# Patient Record
Sex: Female | Born: 1985
Health system: Southern US, Community
[De-identification: ages and names within clinical notes are randomized; demographics above are authoritative.]

## PROBLEM LIST (undated history)

## (undated) DIAGNOSIS — E0590 Thyrotoxicosis, unspecified without thyrotoxic crisis or storm: Secondary | ICD-10-CM

## (undated) DIAGNOSIS — E119 Type 2 diabetes mellitus without complications: Secondary | ICD-10-CM

## (undated) HISTORY — PX: TONSILLECTOMY: SUR1361

---

## 2016-11-21 ENCOUNTER — Emergency Department (HOSPITAL_COMMUNITY)
Admission: EM | Admit: 2016-11-21 | Discharge: 2016-11-21 | Disposition: A | Discharge status: Home / Self Care | Payer: Self-pay | Attending: Emergency Medicine | Admitting: Emergency Medicine

## 2016-11-21 ENCOUNTER — Encounter (HOSPITAL_COMMUNITY): Payer: Self-pay | Admitting: Emergency Medicine

## 2016-11-21 ENCOUNTER — Emergency Department (HOSPITAL_COMMUNITY): Payer: Self-pay

## 2016-11-21 DIAGNOSIS — L03116 Cellulitis of left lower limb: Secondary | ICD-10-CM | POA: Insufficient documentation

## 2016-11-21 DIAGNOSIS — R51 Headache: Secondary | ICD-10-CM | POA: Insufficient documentation

## 2016-11-21 DIAGNOSIS — R110 Nausea: Secondary | ICD-10-CM | POA: Insufficient documentation

## 2016-11-21 DIAGNOSIS — R509 Fever, unspecified: Secondary | ICD-10-CM | POA: Insufficient documentation

## 2016-11-21 DIAGNOSIS — F17210 Nicotine dependence, cigarettes, uncomplicated: Secondary | ICD-10-CM | POA: Insufficient documentation

## 2016-11-21 DIAGNOSIS — R42 Dizziness and giddiness: Secondary | ICD-10-CM | POA: Insufficient documentation

## 2016-11-21 LAB — CBC WITH DIFFERENTIAL/PLATELET
BASOS ABS: 0 10*3/uL (ref 0.0–0.1)
BASOS PCT: 0 %
EOS ABS: 0 10*3/uL (ref 0.0–0.7)
Eosinophils Relative: 1 %
HCT: 35.7 % — ABNORMAL LOW (ref 36.0–46.0)
HEMOGLOBIN: 11.9 g/dL — AB (ref 12.0–15.0)
Lymphocytes Relative: 16 %
Lymphs Abs: 1.3 10*3/uL (ref 0.7–4.0)
MCH: 26.2 pg (ref 26.0–34.0)
MCHC: 33.3 g/dL (ref 30.0–36.0)
MCV: 78.6 fL (ref 78.0–100.0)
MONOS PCT: 4 %
Monocytes Absolute: 0.3 10*3/uL (ref 0.1–1.0)
Neutro Abs: 6.3 10*3/uL (ref 1.7–7.7)
Neutrophils Relative %: 79 %
Platelets: 237 10*3/uL (ref 150–400)
RBC: 4.54 MIL/uL (ref 3.87–5.11)
RDW: 12.1 % (ref 11.5–15.5)
WBC: 7.9 10*3/uL (ref 4.0–10.5)

## 2016-11-21 LAB — COMPREHENSIVE METABOLIC PANEL
ALK PHOS: 74 U/L (ref 38–126)
ALT: 12 U/L — AB (ref 14–54)
ANION GAP: 9 (ref 5–15)
AST: 16 U/L (ref 15–41)
Albumin: 3.3 g/dL — ABNORMAL LOW (ref 3.5–5.0)
BILIRUBIN TOTAL: 0.1 mg/dL — AB (ref 0.3–1.2)
BUN: 15 mg/dL (ref 6–20)
CALCIUM: 9 mg/dL (ref 8.9–10.3)
CO2: 23 mmol/L (ref 22–32)
CREATININE: 0.92 mg/dL (ref 0.44–1.00)
Chloride: 101 mmol/L (ref 101–111)
Glucose, Bld: 415 mg/dL — ABNORMAL HIGH (ref 65–99)
Potassium: 4.1 mmol/L (ref 3.5–5.1)
Sodium: 133 mmol/L — ABNORMAL LOW (ref 135–145)
TOTAL PROTEIN: 7.2 g/dL (ref 6.5–8.1)

## 2016-11-21 LAB — I-STAT CG4 LACTIC ACID, ED: Lactic Acid, Venous: 1.19 mmol/L (ref 0.5–1.9)

## 2016-11-21 MED ORDER — SULFAMETHOXAZOLE-TRIMETHOPRIM 800-160 MG PO TABS: 1.0000 | ORAL_TABLET | Freq: Two times a day (BID) | ORAL | 0 refills | Status: DC

## 2016-11-21 MED ORDER — OXYCODONE-ACETAMINOPHEN 5-325 MG PO TABS
1.0000 | ORAL_TABLET | Freq: Once | ORAL | Status: AC
  Administered 2016-11-21: 1 via ORAL
  Filled 2016-11-21: qty 1

## 2016-11-21 MED ORDER — SULFAMETHOXAZOLE-TRIMETHOPRIM 800-160 MG PO TABS
1.0000 | ORAL_TABLET | Freq: Once | ORAL | Status: AC
Start: 2016-11-21 — End: 2016-11-21
  Administered 2016-11-21: 1 via ORAL
  Filled 2016-11-21: qty 1

## 2016-11-21 MED ORDER — CEPHALEXIN 500 MG PO CAPS
500.0000 mg | ORAL_CAPSULE | Freq: Once | ORAL | Status: AC
  Administered 2016-11-21: 500 mg via ORAL
  Filled 2016-11-21: qty 1

## 2016-11-21 MED ORDER — METFORMIN HCL 500 MG PO TABS: 500.0000 mg | ORAL_TABLET | Freq: Two times a day (BID) | ORAL | 0 refills | Status: AC

## 2016-11-21 MED ORDER — CEPHALEXIN 500 MG PO CAPS: 500.0000 mg | ORAL_CAPSULE | Freq: Four times a day (QID) | ORAL | 0 refills | Status: DC

## 2016-11-21 MED ORDER — HYDROCODONE-ACETAMINOPHEN 5-325 MG PO TABS: 1.0000 | ORAL_TABLET | Freq: Four times a day (QID) | ORAL | 0 refills | Status: DC | PRN

## 2016-11-21 MED ORDER — METFORMIN HCL 500 MG PO TABS
500.0000 mg | ORAL_TABLET | Freq: Once | ORAL | Status: AC
  Administered 2016-11-21: 500 mg via ORAL
  Filled 2016-11-21: qty 1

## 2016-11-21 MED ORDER — IBUPROFEN 200 MG PO TABS
600.0000 mg | ORAL_TABLET | Freq: Once | ORAL | Status: AC
  Administered 2016-11-21: 600 mg via ORAL
  Filled 2016-11-21: qty 3

## 2016-11-21 NOTE — ED Triage Notes (Signed)
Patient reports stepping on a thumbtack a month ago. C/o pain to left foot with swelling and redness. Denies fevers.

## 2016-11-21 NOTE — ED Provider Notes (Signed)
[facility] DEPT Provider Note   CSN: [id number] Arrival date & time: 11/21/16  1824     History   Chief Complaint Chief Complaint  Patient presents with  . Foot Pain    HPI Victoria Everett is a 31 y.o. female who presents to the ED with left foot pain, swelling and redness that started a few days after stepping on a tack. Patient reports that she took tylenol and Aleve and put neosporin on the wound but it continues to worsen. She c/o fever and chills x 3 days.   The history is provided by the patient. No language interpreter was used.  Foot Pain  This is a new problem. The current episode started more than 2 days ago. The problem occurs constantly. The problem has been gradually worsening. Associated symptoms include headaches. Pertinent negatives include no chest pain, no abdominal pain and no shortness of breath. The symptoms are aggravated by walking and standing. Nothing relieves the symptoms. She has tried acetaminophen for the symptoms.    History reviewed. No pertinent past medical history.  There are no active problems to display for this patient.   Past Surgical History:  Procedure Laterality Date  . TONSILLECTOMY      OB History    No data available       Home Medications    Prior to Admission medications   Medication Sig Start Date End Date Taking? Authorizing Provider  cephALEXin (KEFLEX) 500 MG capsule Take 1 capsule (500 mg total) by mouth 4 (four) times daily. 11/21/16   Janne Napoleon, NP  HYDROcodone-acetaminophen (NORCO/VICODIN) 5-325 MG tablet Take 1 tablet by mouth every 6 (six) hours as needed. 11/21/16   Janne Napoleon, NP  metFORMIN (GLUCOPHAGE) 500 MG tablet Take 1 tablet (500 mg total) by mouth 2 (two) times daily with a meal. 11/21/16   Damian Leavell, [place], NP  sulfamethoxazole-trimethoprim (BACTRIM DS,SEPTRA DS) 800-160 MG tablet Take 1 tablet by mouth 2 (two) times daily. 11/21/16 11/28/16  Janne Napoleon, NP    Family History No family history on  file.  Social History Social History  Substance Use Topics  . Smoking status: Current Some Day Smoker  . Smokeless tobacco: Never Used  . Alcohol use Not on file     Allergies   Patient has no known allergies.   Review of Systems Review of Systems  Constitutional: Positive for chills and fever.  HENT: Negative.   Eyes: Negative for visual disturbance.  Respiratory: Negative for cough and shortness of breath.   Cardiovascular: Negative for chest pain.  Gastrointestinal: Positive for nausea. Negative for abdominal pain and vomiting.  Genitourinary: Negative for difficulty urinating, dysuria and frequency.  Musculoskeletal: Positive for arthralgias. Negative for neck pain.       Left foot  Skin: Positive for wound.  Neurological: Positive for light-headedness and headaches.  Psychiatric/Behavioral: Negative for confusion.     Physical Exam Updated Vital Signs BP (!) 160/100 (BP Location: Left Arm)   Pulse 92   Temp 98.9 F (37.2 C)   Resp 16   LMP 10/21/2016   SpO2 100%   Physical Exam  Constitutional: She appears well-developed and well-nourished. No distress.  Eyes: EOM are normal.  Neck: Neck supple.  Cardiovascular: Normal rate.   Pulmonary/Chest: Effort normal.  Abdominal: Soft. There is no tenderness.  Musculoskeletal:       Left foot: There is decreased range of motion (due to swelling and pain), tenderness and swelling. There is normal capillary refill  and no deformity. Lacerations: puncture wound.       Feet:  There is a puncture wound to the dorsum of the left foot. There is tenderness, erythema and swelling of the foot and ankle.   Neurological: She is alert.  Skin: Skin is warm and dry.  Psychiatric: She has a normal mood and affect.  Nursing note and vitals reviewed.    ED Treatments / Results  Labs (all labs ordered are listed, but only abnormal results are displayed) Labs Reviewed  COMPREHENSIVE METABOLIC PANEL - Abnormal; Notable for the  following:       Result Value   Sodium 133 (*)    Glucose, Bld 415 (*)    Albumin 3.3 (*)    ALT 12 (*)    Total Bilirubin 0.1 (*)    All other components within normal limits  CBC WITH DIFFERENTIAL/PLATELET - Abnormal; Notable for the following:    Hemoglobin 11.9 (*)    HCT 35.7 (*)    All other components within normal limits  URINALYSIS, ROUTINE W REFLEX MICROSCOPIC  I-STAT CG4 LACTIC ACID, ED   Radiology Dg Foot 2 Views Left  Result Date: 11/21/2016 CLINICAL DATA:  Stepped on attack 2 weeks ago. Now with recent fever and swelling EXAM: LEFT FOOT - 2 VIEW COMPARISON:  None. FINDINGS: There is no evidence of fracture or dislocation. There is no evidence of arthropathy or other focal bone abnormality. Mild diffuse soft tissue swelling. IMPRESSION: 1. No acute bone abnormality. 2. Diffuse soft tissue swelling. Electronically Signed   By: Signa Kell M.D.   On: 11/21/2016 21:49    Procedures Procedures (including critical care time)  Medications Ordered in ED Medications  oxyCODONE-acetaminophen (PERCOCET/ROXICET) 5-325 MG per tablet 1 tablet (1 tablet Oral Given 11/21/16 2201)  cephALEXin (KEFLEX) capsule 500 mg (500 mg Oral Given 11/21/16 2250)  sulfamethoxazole-trimethoprim (BACTRIM DS,SEPTRA DS) 800-160 MG per tablet 1 tablet (1 tablet Oral Given 11/21/16 2250)  metFORMIN (GLUCOPHAGE) tablet 500 mg (500 mg Oral Given 11/21/16 2250)  ibuprofen (ADVIL,MOTRIN) tablet 600 mg (600 mg Oral Given 11/21/16 2302)  oxyCODONE-acetaminophen (PERCOCET/ROXICET) 5-325 MG per tablet 1 tablet (1 tablet Oral Given 11/21/16 2302)     Initial Impression / Assessment and Plan / ED Course  I have reviewed the triage vital signs and the nursing notes. Dr. Rhunette Croft in to examine the patient and discuss plan of care and need for f/u.   31 y.o. female with new onset diabetes and left foot cellulitis stable for d/c to f/u with the wound clinic and a PCP to manage diabetes. Will start Bactrim, Keflex and  Metformin. Order for Care Management consult and referral to wound clinic. Return precautions.   Final Clinical Impressions(s) / ED Diagnoses   Final diagnoses:  Cellulitis of foot, left    New Prescriptions New Prescriptions   CEPHALEXIN (KEFLEX) 500 MG CAPSULE    Take 1 capsule (500 mg total) by mouth 4 (four) times daily.   HYDROCODONE-ACETAMINOPHEN (NORCO/VICODIN) 5-325 MG TABLET    Take 1 tablet by mouth every 6 (six) hours as needed.   METFORMIN (GLUCOPHAGE) 500 MG TABLET    Take 1 tablet (500 mg total) by mouth 2 (two) times daily with a meal.   SULFAMETHOXAZOLE-TRIMETHOPRIM (BACTRIM DS,SEPTRA DS) 800-160 MG TABLET    Take 1 tablet by mouth 2 (two) times daily.     Kerrie Buffalo [place], [place] 11/21/16 1610    Derwood Kaplan, MD 11/22/16 [phone]

## 2016-11-24 ENCOUNTER — Inpatient Hospital Stay (HOSPITAL_COMMUNITY)
Admission: EM | Admit: 2016-11-24 | Discharge: 2016-11-28 | DRG: 638 | Disposition: A | Discharge status: Home / Self Care | Payer: Self-pay | Attending: Internal Medicine | Admitting: Internal Medicine

## 2016-11-24 ENCOUNTER — Encounter (HOSPITAL_COMMUNITY): Payer: Self-pay | Admitting: Emergency Medicine

## 2016-11-24 DIAGNOSIS — L0390 Cellulitis, unspecified: Secondary | ICD-10-CM | POA: Diagnosis present

## 2016-11-24 DIAGNOSIS — Z87891 Personal history of nicotine dependence: Secondary | ICD-10-CM

## 2016-11-24 DIAGNOSIS — E1165 Type 2 diabetes mellitus with hyperglycemia: Secondary | ICD-10-CM | POA: Diagnosis present

## 2016-11-24 DIAGNOSIS — Z791 Long term (current) use of non-steroidal anti-inflammatories (NSAID): Secondary | ICD-10-CM

## 2016-11-24 DIAGNOSIS — R030 Elevated blood-pressure reading, without diagnosis of hypertension: Secondary | ICD-10-CM | POA: Diagnosis present

## 2016-11-24 DIAGNOSIS — W228XXA Striking against or struck by other objects, initial encounter: Secondary | ICD-10-CM | POA: Diagnosis present

## 2016-11-24 DIAGNOSIS — E119 Type 2 diabetes mellitus without complications: Secondary | ICD-10-CM

## 2016-11-24 DIAGNOSIS — L97429 Non-pressure chronic ulcer of left heel and midfoot with unspecified severity: Secondary | ICD-10-CM | POA: Diagnosis present

## 2016-11-24 DIAGNOSIS — L03116 Cellulitis of left lower limb: Secondary | ICD-10-CM | POA: Diagnosis present

## 2016-11-24 DIAGNOSIS — Z833 Family history of diabetes mellitus: Secondary | ICD-10-CM

## 2016-11-24 DIAGNOSIS — L97421 Non-pressure chronic ulcer of left heel and midfoot limited to breakdown of skin: Secondary | ICD-10-CM

## 2016-11-24 DIAGNOSIS — D649 Anemia, unspecified: Secondary | ICD-10-CM | POA: Diagnosis present

## 2016-11-24 DIAGNOSIS — Z7984 Long term (current) use of oral hypoglycemic drugs: Secondary | ICD-10-CM

## 2016-11-24 DIAGNOSIS — E11621 Type 2 diabetes mellitus with foot ulcer: Principal | ICD-10-CM | POA: Diagnosis present

## 2016-11-24 DIAGNOSIS — E1142 Type 2 diabetes mellitus with diabetic polyneuropathy: Secondary | ICD-10-CM | POA: Diagnosis present

## 2016-11-24 DIAGNOSIS — I10 Essential (primary) hypertension: Secondary | ICD-10-CM | POA: Diagnosis present

## 2016-11-24 DIAGNOSIS — E08621 Diabetes mellitus due to underlying condition with foot ulcer: Secondary | ICD-10-CM

## 2016-11-24 LAB — BASIC METABOLIC PANEL
Anion gap: 10 (ref 5–15)
BUN: 16 mg/dL (ref 6–20)
CALCIUM: 9.4 mg/dL (ref 8.9–10.3)
CO2: 25 mmol/L (ref 22–32)
CREATININE: 0.88 mg/dL (ref 0.44–1.00)
Chloride: 97 mmol/L — ABNORMAL LOW (ref 101–111)
GFR calc Af Amer: >60 mL/min (ref >60)
GLUCOSE: 381 mg/dL — AB (ref 65–99)
Potassium: 4.5 mmol/L (ref 3.5–5.1)
Sodium: 132 mmol/L — ABNORMAL LOW (ref 135–145)

## 2016-11-24 LAB — CBC WITH DIFFERENTIAL/PLATELET
Basophils Absolute: 0 10*3/uL (ref 0.0–0.1)
Basophils Relative: 0 %
EOS PCT: 1 %
Eosinophils Absolute: 0.1 10*3/uL (ref 0.0–0.7)
HCT: 35.1 % — ABNORMAL LOW (ref 36.0–46.0)
Hemoglobin: 11.8 g/dL — ABNORMAL LOW (ref 12.0–15.0)
LYMPHS ABS: 2.4 10*3/uL (ref 0.7–4.0)
LYMPHS PCT: 30 %
MCH: 26.4 pg (ref 26.0–34.0)
MCHC: 33.6 g/dL (ref 30.0–36.0)
MCV: 78.5 fL (ref 78.0–100.0)
MONO ABS: 0.4 10*3/uL (ref 0.1–1.0)
Monocytes Relative: 5 %
Neutro Abs: 5.2 10*3/uL (ref 1.7–7.7)
Neutrophils Relative %: 64 %
PLATELETS: 313 10*3/uL (ref 150–400)
RBC: 4.47 MIL/uL (ref 3.87–5.11)
RDW: 12.1 % (ref 11.5–15.5)
WBC: 8.1 10*3/uL (ref 4.0–10.5)

## 2016-11-24 LAB — URINALYSIS, ROUTINE W REFLEX MICROSCOPIC
BILIRUBIN URINE: NEGATIVE
Glucose, UA: >=500 mg/dL — AB
Ketones, ur: NEGATIVE mg/dL
Leukocytes, UA: NEGATIVE
Nitrite: NEGATIVE
Protein, ur: >=300 mg/dL — AB
SPECIFIC GRAVITY, URINE: 1.024 (ref 1.005–1.030)
pH: 5 (ref 5.0–8.0)

## 2016-11-24 LAB — CBG MONITORING, ED: Glucose-Capillary: 386 mg/dL — ABNORMAL HIGH (ref 65–99)

## 2016-11-24 MED ORDER — SODIUM CHLORIDE 0.9 % IV SOLN
INTRAVENOUS | Status: DC
Start: 2016-11-24 — End: 2016-11-24
  Administered 2016-11-24: 22:00:00 via INTRAVENOUS

## 2016-11-24 MED ORDER — ONDANSETRON 4 MG PO TBDP
4.0000 mg | ORAL_TABLET | Freq: Once | ORAL | Status: AC
  Administered 2016-11-24: 4 mg via ORAL
  Filled 2016-11-24: qty 1

## 2016-11-24 MED ORDER — VANCOMYCIN HCL IN DEXTROSE 1-5 GM/200ML-% IV SOLN
1000.0000 mg | Freq: Once | INTRAVENOUS | Status: AC
  Administered 2016-11-24: 1000 mg via INTRAVENOUS
  Filled 2016-11-24: qty 200

## 2016-11-24 MED ORDER — HYDROMORPHONE HCL 1 MG/ML IJ SOLN
1.0000 mg | Freq: Once | INTRAMUSCULAR | Status: AC
  Administered 2016-11-24: 1 mg via INTRAVENOUS
  Filled 2016-11-24: qty 1

## 2016-11-24 NOTE — ED Notes (Signed)
Bed: WA05 Expected date:  Expected time:  Means of arrival:  Comments: 

## 2016-11-24 NOTE — ED Provider Notes (Signed)
[facility] Provider Note   CSN: [id number] Arrival date & time: 11/24/16  1750     History   Chief Complaint Chief Complaint  Patient presents with  . Foot Swelling  . Foot Pain    HPI Victoria Everett is a 31 y.o. female.  HPI  Patient with history of non-insulin-dependent diabetes comes in with chief complaint of foot pain. Patient has had an ulcer to her left foot for the past few days. Patient stepped on a sharp object (not a nail) which has worsened over the past few days. Patient noticing swelling over the left foot. Patient was seen in the ER on 9:30, and discharged with Bactrim and Keflex along with wound care follow-up. Patient called her clinic however she will not be afforded an appointment until the middle of the month. Patient reports that since her visit to the emergency room the swelling has gotten worse and she has clear discharge from her lesion. Patient denies any nausea or vomiting or fevers, she is having some subjective chills. Patient also doesn't have a primary care doctor in [place] she is new to state.  History reviewed. No pertinent past medical history.  Patient Active Problem List   Diagnosis Date Noted  . Cellulitis of foot, left 11/25/2016    Past Surgical History:  Procedure Laterality Date  . TONSILLECTOMY      OB History    No data available       Home Medications    Prior to Admission medications   Medication Sig Start Date End Date Taking? Authorizing Provider  cephALEXin (KEFLEX) 500 MG capsule Take 1 capsule (500 mg total) by mouth 4 (four) times daily. 11/21/16  Yes Neese, Bridgette Habermann M, NP  metFORMIN (GLUCOPHAGE) 500 MG tablet Take 1 tablet (500 mg total) by mouth 2 (two) times daily with a meal. 11/21/16  Yes Neese, Cando, NP  naproxen sodium (ANAPROX) 220 MG tablet Take 440 mg by mouth 2 (two) times daily with a meal.   Yes [provider]  sulfamethoxazole-trimethoprim (BACTRIM DS,SEPTRA DS) 800-160 MG tablet  Take 1 tablet by mouth 2 (two) times daily. 11/21/16 11/28/16 Yes Neese, Hope M, NP  HYDROcodone-acetaminophen (NORCO/VICODIN) 5-325 MG tablet Take 1 tablet by mouth every 6 (six) hours as needed. 11/21/16   Janne Napoleon, NP    Family History History reviewed. No pertinent family history.  Social History Social History  Substance Use Topics  . Smoking status: Current Some Day Smoker  . Smokeless tobacco: Never Used  . Alcohol use Not on file     Allergies   Patient has no known allergies.   Review of Systems Review of Systems  Constitutional: Positive for chills. Negative for fever.  Musculoskeletal: Positive for myalgias.  Skin: Positive for wound.  Allergic/Immunologic: Negative for immunocompromised state.     Physical Exam Updated Vital Signs BP (!) 161/102 (BP Location: Right Arm)   Pulse 88   Temp 98.3 F (36.8 C) (Oral)   Resp 16   Ht  (1.803 m)   Wt 95.6 kg (210 lb 11.2 oz)   LMP 11/21/2016   SpO2 99%   BMI 29.39 kg/m   Physical Exam  Constitutional: She is oriented to person, place, and time. She appears well-developed.  HENT:  Head: Normocephalic and atraumatic.  Eyes: EOM are normal.  Neck: Normal range of motion. Neck supple.  Cardiovascular: Normal rate.   Pulmonary/Chest: Effort normal.  Abdominal: Bowel sounds are normal.  Musculoskeletal: She exhibits  edema.  Pt has an ulcer on the plantar side of the L foot. There is no surrounding erythema. There is no active purulent drainage.  + edema of the leg extending to the mid-tibia level.  Neurological: She is alert and oriented to person, place, and time.  Skin: Skin is warm and dry.  Nursing note and vitals reviewed.    ED Treatments / Results  Labs (all labs ordered are listed, but only abnormal results are displayed) Labs Reviewed  CBC WITH DIFFERENTIAL/PLATELET - Abnormal; Notable for the following:       Result Value   Hemoglobin 11.8 (*)    HCT 35.1 (*)    All other components  within normal limits  BASIC METABOLIC PANEL - Abnormal; Notable for the following:    Sodium 132 (*)    Chloride 97 (*)    Glucose, Bld 381 (*)    All other components within normal limits  URINALYSIS, ROUTINE W REFLEX MICROSCOPIC - Abnormal; Notable for the following:    APPearance HAZY (*)    Glucose, UA >=500 (*)    Hgb urine dipstick MODERATE (*)    Protein, ur >=300 (*)    Bacteria, UA RARE (*)    Squamous Epithelial / LPF 0-5 (*)    All other components within normal limits  SEDIMENTATION RATE - Abnormal; Notable for the following:    Sed Rate 73 (*)    All other components within normal limits  CBG MONITORING, ED - Abnormal; Notable for the following:    Glucose-Capillary 386 (*)    All other components within normal limits  C-REACTIVE PROTEIN  PREGNANCY, URINE    EKG  EKG Interpretation None       Radiology No results found.  Procedures Procedures (including critical care time)  Medications Ordered in ED Medications  0.9 %  sodium chloride infusion ( Intravenous New Bag/Given 11/24/16 2156)  ondansetron (ZOFRAN-ODT) disintegrating tablet 4 mg (4 mg Oral Given 11/24/16 2157)  HYDROmorphone (DILAUDID) injection 1 mg (1 mg Intravenous Given 11/24/16 2156)  vancomycin (VANCOCIN) IVPB 1000 mg/200 mL premix (0 mg Intravenous Stopped 11/24/16 2256)  HYDROcodone-acetaminophen (NORCO/VICODIN) 5-325 MG per tablet 1 tablet (1 tablet Oral Given 11/25/16 0138)     Initial Impression / Assessment and Plan / ED Course  I have reviewed the triage vital signs and the nursing notes.  Pertinent labs & imaging results that were available during my care of the patient were reviewed by me and considered in my medical decision making (see chart for details).  Clinical Course as of Nov 26 143  Thu Nov 25, 2016  0144 With elevated sed rate, worsening leg swelling, new onset diabetes that is not controlled well, lack of proper f/u  - we will admit to the hospital. Sed Rate: (!) 73  [AN]    Clinical Course User Index [AN] Derwood Kaplan, MD   Patient comes in with chief complaint of leg pain. Patient is diabetic and has a foot ulcer. The ulcer has been present now close to 2 weeks. Patient has been started on antibiotics, however she reports that the edema in her leg has gotten worse. Patient does not have any systemic constitutional review of system besides subjective chills. Patient's foot exam does not reveal any evidence of active abscess or gas. X-ray from last visit reviewed.   nfortunately patient was unable to get a wound care follow-up appointment until middle of this month, and she doesn't have a primary care doctor. Therefore with her  stating that her swelling has gotten worse over the past with today's is a little bit concerning to me. Effectively elect to make sure the patient is not having any osteomyelitis. Sedimentation rate and CRP have been added to the lab workup. f the labs are elevated we will discharge patient and asked her to return for MRI.  Patient is already received a dose of vancomycin while waiting for my care.  Final Clinical Impressions(s) / ED Diagnoses   Final diagnoses:  New onset type 2 diabetes mellitus (HCC)  Diabetic ulcer of left midfoot associated with diabetes mellitus due to underlying condition, limited to breakdown of skin [facility])    New Prescriptions New Prescriptions   No medications on file     Derwood Kaplan, MD 11/25/16 0145

## 2016-11-24 NOTE — ED Triage Notes (Signed)
Patient has left foot swelling and pain. Patient was seen in the ED on Sunday. Patient states she tried to go to wound care were she was referred and says they have no appointments available.

## 2016-11-24 NOTE — ED Provider Notes (Signed)
MSE was initiated and I personally evaluated the patient and placed orders (if any) at  7:40 PM on November 24, 2016.  BP (!) 145/94 (BP Location: Left Arm)   Pulse 96   Temp 98.9 F (37.2 C) (Oral)   Resp 18   Ht  (1.803 m)   Wt 95.6 kg (210 lb 11.2 oz)   LMP 11/21/2016   SpO2 97%   BMI 29.39 kg/m   HPI Victoria Everett is a 30 y.o. female who presents to the ED for recheck of her wound. Patient was evaluated here 11/21/16 by Dr. Rhunette Croft and me for small ulcer/callous like lesion to the plantar aspect of the left foot after stepping on a nail. She was treated at the first visit for cellulitis and found to be a diabetic. She was referred to [facility] and to the Wound Clinic for follow up. A future order for Case Manager Consult was placed. Patient d/c home with Keflex, Bactrim, hydrocodone and Metformin.  Patient reports that she can no get an appointment with the Wound clinic. The foot pain and swelling has gotten worse.    Results for orders placed or performed during the hospital encounter of 11/24/16 (from the past 24 hour(s))  POC CBG, ED     Status: Abnormal   Collection Time: 11/24/16  7:22 PM  Result Value Ref Range   Glucose-Capillary 386 (H) 65 - 99 mg/dL    The patient appears stable so that the remainder of the MSE may be completed by another provider.   Kerrie Buffalo [place], [place] 11/24/16 1950    Arby Barrette, MD 11/25/16 2342

## 2016-11-25 ENCOUNTER — Encounter (HOSPITAL_COMMUNITY): Payer: Self-pay | Admitting: Internal Medicine

## 2016-11-25 ENCOUNTER — Inpatient Hospital Stay (HOSPITAL_COMMUNITY): Payer: Self-pay

## 2016-11-25 DIAGNOSIS — R030 Elevated blood-pressure reading, without diagnosis of hypertension: Secondary | ICD-10-CM

## 2016-11-25 DIAGNOSIS — L0390 Cellulitis, unspecified: Secondary | ICD-10-CM | POA: Diagnosis present

## 2016-11-25 DIAGNOSIS — D649 Anemia, unspecified: Secondary | ICD-10-CM | POA: Diagnosis present

## 2016-11-25 DIAGNOSIS — E119 Type 2 diabetes mellitus without complications: Secondary | ICD-10-CM

## 2016-11-25 DIAGNOSIS — L03116 Cellulitis of left lower limb: Secondary | ICD-10-CM | POA: Diagnosis present

## 2016-11-25 LAB — GLUCOSE, CAPILLARY
GLUCOSE-CAPILLARY: 196 mg/dL — AB (ref 65–99)
GLUCOSE-CAPILLARY: 318 mg/dL — AB (ref 65–99)
Glucose-Capillary: 326 mg/dL — ABNORMAL HIGH (ref 65–99)
Glucose-Capillary: 349 mg/dL — ABNORMAL HIGH (ref 65–99)
Glucose-Capillary: 303 mg/dL — ABNORMAL HIGH (ref 65–99)

## 2016-11-25 LAB — BASIC METABOLIC PANEL
Anion gap: 8 (ref 5–15)
BUN: 13 mg/dL (ref 6–20)
CHLORIDE: 99 mmol/L — AB (ref 101–111)
CO2: 26 mmol/L (ref 22–32)
Calcium: 8.7 mg/dL — ABNORMAL LOW (ref 8.9–10.3)
Creatinine, Ser: 0.69 mg/dL (ref 0.44–1.00)
GFR calc Af Amer: >60 mL/min (ref >60)
GFR calc non Af Amer: >60 mL/min (ref >60)
GLUCOSE: 343 mg/dL — AB (ref 65–99)
POTASSIUM: 3.8 mmol/L (ref 3.5–5.1)
Sodium: 133 mmol/L — ABNORMAL LOW (ref 135–145)

## 2016-11-25 LAB — PREGNANCY, URINE: Preg Test, Ur: NEGATIVE

## 2016-11-25 LAB — HIV ANTIBODY (ROUTINE TESTING W REFLEX): HIV Screen 4th Generation wRfx: NONREACTIVE

## 2016-11-25 LAB — VITAMIN B12: Vitamin B-12: 449 pg/mL (ref 180–914)

## 2016-11-25 LAB — CBC
HEMATOCRIT: 31.4 % — AB (ref 36.0–46.0)
Hemoglobin: 10.3 g/dL — ABNORMAL LOW (ref 12.0–15.0)
MCH: 25.4 pg — ABNORMAL LOW (ref 26.0–34.0)
MCHC: 32.8 g/dL (ref 30.0–36.0)
MCV: 77.5 fL — AB (ref 78.0–100.0)
Platelets: 252 10*3/uL (ref 150–400)
RBC: 4.05 MIL/uL (ref 3.87–5.11)
RDW: 11.9 % (ref 11.5–15.5)
WBC: 5.7 10*3/uL (ref 4.0–10.5)

## 2016-11-25 LAB — RETICULOCYTES
RBC.: 4.05 MIL/uL (ref 3.87–5.11)
Retic Count, Absolute: 64.8 10*3/uL (ref 19.0–186.0)
Retic Ct Pct: 1.6 % (ref 0.4–3.1)

## 2016-11-25 LAB — FERRITIN: Ferritin: 123 ng/mL (ref 11–307)

## 2016-11-25 LAB — FOLATE: FOLATE: 20.5 ng/mL (ref >5.9)

## 2016-11-25 LAB — C-REACTIVE PROTEIN: CRP: 4 mg/dL — ABNORMAL HIGH (ref <1.0)

## 2016-11-25 LAB — IRON AND TIBC
Iron: 28 ug/dL (ref 28–170)
SATURATION RATIOS: 11 % (ref 10.4–31.8)
TIBC: 259 ug/dL (ref 250–450)
UIBC: 231 ug/dL

## 2016-11-25 LAB — HEMOGLOBIN A1C
HEMOGLOBIN A1C: 13.3 % — AB (ref 4.8–5.6)
MEAN PLASMA GLUCOSE: 335.01 mg/dL

## 2016-11-25 LAB — SEDIMENTATION RATE: Sed Rate: 73 mm/hr — ABNORMAL HIGH (ref 0–22)

## 2016-11-25 MED ORDER — INSULIN ASPART 100 UNIT/ML ~~LOC~~ SOLN
0.0000 [IU] | Freq: Three times a day (TID) | SUBCUTANEOUS | Status: DC
  Administered 2016-11-25: 7 [IU] via SUBCUTANEOUS
  Administered 2016-11-25: 2 [IU] via SUBCUTANEOUS
  Administered 2016-11-25 – 2016-11-26 (×2): 7 [IU] via SUBCUTANEOUS
  Administered 2016-11-26: 5 [IU] via SUBCUTANEOUS
  Administered 2016-11-26: 3 [IU] via SUBCUTANEOUS
  Administered 2016-11-27: 5 [IU] via SUBCUTANEOUS
  Administered 2016-11-27: 3 [IU] via SUBCUTANEOUS
  Administered 2016-11-27: 7 [IU] via SUBCUTANEOUS
  Administered 2016-11-28: 5 [IU] via SUBCUTANEOUS

## 2016-11-25 MED ORDER — ENOXAPARIN SODIUM 40 MG/0.4ML ~~LOC~~ SOLN
40.0000 mg | SUBCUTANEOUS | Status: DC
  Administered 2016-11-25 – 2016-11-28 (×4): 40 mg via SUBCUTANEOUS
  Filled 2016-11-25 (×4): qty 0.4

## 2016-11-25 MED ORDER — PIPERACILLIN-TAZOBACTAM 3.375 G IVPB
3.3750 g | Freq: Three times a day (TID) | INTRAVENOUS | Status: DC
  Administered 2016-11-25 – 2016-11-26 (×3): 3.375 g via INTRAVENOUS
  Filled 2016-11-25 (×5): qty 50

## 2016-11-25 MED ORDER — PIPERACILLIN-TAZOBACTAM 3.375 G IVPB 30 MIN
3.3750 g | Freq: Once | INTRAVENOUS | Status: AC
  Administered 2016-11-25: 3.375 g via INTRAVENOUS
  Filled 2016-11-25: qty 50

## 2016-11-25 MED ORDER — ONDANSETRON HCL 4 MG PO TABS: 4.0000 mg | ORAL_TABLET | Freq: Four times a day (QID) | ORAL | Status: DC | PRN

## 2016-11-25 MED ORDER — HYDROCODONE-ACETAMINOPHEN 5-325 MG PO TABS
1.0000 | ORAL_TABLET | ORAL | Status: DC | PRN
  Administered 2016-11-25 – 2016-11-28 (×3): 1 via ORAL
  Filled 2016-11-25 (×4): qty 1

## 2016-11-25 MED ORDER — LISINOPRIL 20 MG PO TABS
20.0000 mg | ORAL_TABLET | Freq: Every day | ORAL | Status: DC
  Administered 2016-11-25 – 2016-11-26 (×2): 20 mg via ORAL
  Filled 2016-11-25 (×2): qty 1

## 2016-11-25 MED ORDER — INSULIN GLARGINE 100 UNIT/ML ~~LOC~~ SOLN
5.0000 [IU] | Freq: Every day | SUBCUTANEOUS | Status: DC
  Administered 2016-11-25: 5 [IU] via SUBCUTANEOUS
  Filled 2016-11-25: qty 0.05

## 2016-11-25 MED ORDER — ACETAMINOPHEN 325 MG PO TABS
650.0000 mg | ORAL_TABLET | Freq: Four times a day (QID) | ORAL | Status: DC | PRN
  Administered 2016-11-25 – 2016-11-26 (×2): 650 mg via ORAL
  Filled 2016-11-25 (×2): qty 2

## 2016-11-25 MED ORDER — TRAMADOL HCL 50 MG PO TABS
50.0000 mg | ORAL_TABLET | Freq: Four times a day (QID) | ORAL | Status: DC | PRN
  Administered 2016-11-25: 50 mg via ORAL
  Filled 2016-11-25: qty 1

## 2016-11-25 MED ORDER — LIVING WELL WITH DIABETES BOOK
Freq: Once | Status: AC
  Administered 2016-11-25: 12:00:00
  Filled 2016-11-25: qty 1

## 2016-11-25 MED ORDER — HYDROCODONE-ACETAMINOPHEN 5-325 MG PO TABS
1.0000 | ORAL_TABLET | Freq: Once | ORAL | Status: AC
  Administered 2016-11-25: 1 via ORAL
  Filled 2016-11-25: qty 1

## 2016-11-25 MED ORDER — SODIUM CHLORIDE 0.9 % IV SOLN
1250.0000 mg | Freq: Two times a day (BID) | INTRAVENOUS | Status: DC
  Administered 2016-11-25 – 2016-11-26 (×3): 1250 mg via INTRAVENOUS
  Filled 2016-11-25 (×3): qty 1250

## 2016-11-25 MED ORDER — ONDANSETRON HCL 4 MG/2ML IJ SOLN
4.0000 mg | Freq: Four times a day (QID) | INTRAMUSCULAR | Status: DC | PRN
  Administered 2016-11-25: 4 mg via INTRAVENOUS
  Filled 2016-11-25: qty 2

## 2016-11-25 MED ORDER — HYDROMORPHONE HCL-NACL 0.5-0.9 MG/ML-% IV SOSY
1.0000 mg | PREFILLED_SYRINGE | INTRAVENOUS | Status: DC | PRN
  Administered 2016-11-25 – 2016-11-28 (×15): 1 mg via INTRAVENOUS
  Filled 2016-11-25 (×15): qty 2

## 2016-11-25 MED ORDER — INSULIN GLARGINE 100 UNIT/ML ~~LOC~~ SOLN
15.0000 [IU] | Freq: Every day | SUBCUTANEOUS | Status: DC
  Filled 2016-11-25: qty 0.15

## 2016-11-25 MED ORDER — HYDRALAZINE HCL 20 MG/ML IJ SOLN
10.0000 mg | INTRAMUSCULAR | Status: DC | PRN
  Administered 2016-11-25 – 2016-11-26 (×2): 10 mg via INTRAVENOUS
  Filled 2016-11-25 (×2): qty 1

## 2016-11-25 MED ORDER — ACETAMINOPHEN 650 MG RE SUPP: 650.0000 mg | Freq: Four times a day (QID) | RECTAL | Status: DC | PRN

## 2016-11-25 MED ORDER — INSULIN GLARGINE 100 UNIT/ML ~~LOC~~ SOLN
10.0000 [IU] | Freq: Once | SUBCUTANEOUS | Status: AC
  Administered 2016-11-25: 10 [IU] via SUBCUTANEOUS
  Filled 2016-11-25: qty 0.1

## 2016-11-25 MED ORDER — KETOROLAC TROMETHAMINE 15 MG/ML IJ SOLN
15.0000 mg | Freq: Once | INTRAMUSCULAR | Status: AC
  Administered 2016-11-25: 15 mg via INTRAVENOUS
  Filled 2016-11-25: qty 1

## 2016-11-25 NOTE — Progress Notes (Signed)
[facility] PROGRESS NOTE  Victoria Everett YKZ:[id number] DOB: [birth year] DOA: 11/24/2016  PCP: Patient, [name]  Brief History/Interval Summary: 31 year old African-American female presented with complaints of left foot pain and swelling. She apparently stepped on a sharp object 2 weeks ago. She presented to the emergency department a few days prior to this visit, was diagnosed with cellulitis and diabetes and was discharged on oral antibiotics and metformin. Patient presented back to the emergency department with worsening symptoms. She was hospitalized for further management.  Reason for Visit: Cellulitis of the left lower extremity including foot  Consultants: None  Procedures: None  Antibiotics: Vancomycin and Zosyn  Subjective/Interval History: Patient complains of pain in the left foot. She had noticed yellowish drainage previously but none currently. Complains of some numbness in the right foot as well.  ROS: Denies any chest pain or shortness of breath. No nausea, vomiting.  Objective:  Vital Signs  Vitals:   11/24/16 1801 11/25/16 0248 11/25/16 0400  BP: (!) 145/94 (!) 161/102 (!) 152/74  Pulse: 96 88   Resp: 18 16   Temp: 98.9 F (37.2 C) 98.3 F (36.8 C)   TempSrc: Oral Oral   SpO2: 97% 99%   Weight: 95.6 kg (210 lb 11.2 oz)    Height: 5' 11"  (1.803 m)      Intake/Output Summary (Last 24 hours) at 11/25/16 1302 Last data filed at 11/25/16 1002  Gross per 24 hour  Intake              690 ml  Output                0 ml  Net              690 ml   Filed Weights   11/24/16 1801  Weight: 95.6 kg (210 lb 11.2 oz)    General appearance: alert, cooperative, appears stated age and no distress Head: Normocephalic, without obvious abnormality, atraumatic Resp: clear to auscultation bilaterally Cardio: regular rate and rhythm, S1, S2 normal, no murmur, click, rub or gallop GI: soft, non-tender; bowel sounds normal; no masses,  no  organomegaly Extremities: Left lower extremity noted to be more swollen compared to the right. Warm to touch. No clear erythema. There is a wound noted in the plantar aspect. No drainage. Good peripheral pulses. Neurologic: Awake and alert. Oriented 3. Cranial nerves 2-12 intact. Motor strength equal bilateral upper and lower extremities.  Lab Results:  Data Reviewed: I have personally reviewed following labs and imaging studies  CBC:  Recent Labs Lab 11/21/16 1927 11/24/16 2044 11/25/16 0456  WBC 7.9 8.1 5.7  NEUTROABS 6.3 5.2  --   HGB 11.9* 11.8* 10.3*  HCT 35.7* 35.1* 31.4*  MCV 78.6 78.5 77.5*  PLT 237 313 939    Basic Metabolic Panel:  Recent Labs Lab 11/21/16 1927 11/24/16 2044 11/25/16 0456  NA 133* 132* 133*  K 4.1 4.5 3.8  CL 101 97* 99*  CO2 23 25 26   GLUCOSE 415* 381* 343*  BUN 15 16 13   CREATININE 0.92 0.88 0.69  CALCIUM 9.0 9.4 8.7*    GFR: Estimated Creatinine Clearance: 128.6 mL/min (by C-G formula based on SCr of 0.69 mg/dL).  Liver Function Tests:  Recent Labs Lab 11/21/16 1927  AST 16  ALT 12*  ALKPHOS 74  BILITOT 0.1*  PROT 7.2  ALBUMIN 3.3*    HbA1C:  Recent Labs  11/25/16 0456  HGBA1C 13.3*    CBG:  Recent Labs Lab  11/24/16 1922 11/25/16 0235 11/25/16 0731 11/25/16 1136  GLUCAP 386* 326* 349* 303*    Anemia Panel:  Recent Labs  11/25/16 0456  VITAMINB12 449  FOLATE 20.5  FERRITIN 123  TIBC 259  IRON 28  RETICCTPCT 1.6     Radiology Studies: Mr Foot Left Wo Contrast  Result Date: 11/25/2016 CLINICAL DATA:  Left plantar redness and swelling after stepping on a thumb tack about a month ago. Evaluate for osteomyelitis. EXAM: MRI OF THE LEFT FOOT WITHOUT CONTRAST TECHNIQUE: Multiplanar, multisequence MR imaging of the left forefoot was performed. No intravenous contrast was administered. COMPARISON:  Left foot x-rays dated November 21, 2016. FINDINGS: Bones/Joint/Cartilage No suspicious marrow signal  abnormality. No fracture or focal bone lesion. No joint effusion. Ligaments The collateral ligaments are intact. The Lisfranc ligament is intact. Muscles and Tendons Nonspecific edema of the intrinsic muscles of the forefoot, which can be seen with diabetic muscle changes. The visualized flexor and extensor tendons are intact. Soft tissues Plantar soft tissue induration and edema beneath the fifth metatarsal head with questionable small skin defect. Prominent soft tissue swelling of the dorsum of the forefoot. No discrete drainable fluid collection. IMPRESSION: 1. Plantar soft tissue induration and edema beneath the fifth metatarsal head with questionable small skin defect. This may represent the site of prior puncture wound. No drainable fluid collection or osteomyelitis. 2. Prominent soft tissue swelling of the dorsum of the forefoot, nonspecific, but can be seen with cellulitis. Electronically Signed   By: Titus Dubin M.D.   On: 11/25/2016 08:59     Medications:  Scheduled: . enoxaparin (LOVENOX) injection  40 mg Subcutaneous Q24H  . insulin aspart  0-9 Units Subcutaneous TID WC  . [START ON 11/26/2016] insulin glargine  15 Units Subcutaneous Daily   Continuous: . piperacillin-tazobactam (ZOSYN)  IV 3.375 g (11/25/16 1212)  . vancomycin Stopped (11/25/16 0725)   VEH:[id number] **OR** acetaminophen, hydrALAZINE, HYDROcodone-acetaminophen, HYDROmorphone (DILAUDID) injection, ondansetron **OR** ondansetron (ZOFRAN) IV, traMADol  Assessment/Plan:  Active Problems:   Cellulitis of foot, left   New onset type 2 diabetes mellitus (HCC)   Elevated blood pressure reading   Normochromic normocytic anemia   Cellulitis    Left foot cellulitis with diabetic foot ulcer MRI of the foot was performed and does not show any osteomyelitis or abscess. ESR was noted to be elevated. Patient was placed on vancomycin and Zosyn, which is to be continued. Keep left leg elevated. Pain control. She did fail  outpatient management with oral antibiotics.  Newly diagnosed diabetes mellitus type 2 with hyperglycemia/possible peripheral neuropathy HbA1c 13.3. Patient tells me that she was told that she had high glucose levels back in February. However, due to financial reason she could not get follow-up with outpatient providers and could not get started on treatment. Patient was given metformin when she was recently seen in the emergency department. Hyperglycemia could be due to infection. For now we will continue with Lantus and higher dose. Continue sliding scale insulin coverage. Diabetes education. UA does show proteinuria. She will benefit from ACE inhibitor , which will also help her elevated blood pressures. Patient does mention some numbness in her feet. This could be suggestive of diabetic neuropathy. No focal neurological deficits noted.  Normocytic anemia. Anemia panel reviewed. No clear deficiencies identified. This could be anemia of chronic disease.  Elevated blood pressure, likely essential hypertension Blood Pressure could be elevated due to pain as well. Initiate ACE inhibitor as discussed above.  DVT Prophylaxis: Lovenox  Code Status: Full code  Family Communication: Discussed with the patient  Disposition Plan: Management as outlined above.    LOS: 0 days   [facility] Pager [phone] 11/25/2016, 1:02 PM  If 7PM-7AM, please contact night-coverage at [email or web address], password [email or web address]

## 2016-11-25 NOTE — Progress Notes (Signed)
Spoke with patient at bedside regarding PCP f/u. Patient has made an appt with a practice, can not recall the name, it is at the end of the month. Agreed to let me try to get her in a one of our clinics but they are also booked until the middle of next month. Provided her with other resources for primary care and community resources such as [facility]. Discussed need for diabetes f/u, where to get a home glucose meter. She is reviewing her diabetes management material provided. She also has an appt with [facility], she was unable to get an appt in [place], encouraged her to keep that appt.

## 2016-11-25 NOTE — Progress Notes (Signed)
Pharmacy Antibiotic Note  Victoria Everett is a 31 y.o. female with left foot swelling and pain admitted on 11/24/2016 with cellulitis.  Pharmacy has been consulted for zosyn and vancomycin dosing.  Plan: Zosyn 3.375g IV q8h (4 hour infusion).  Vancomycin 1 Gm x1 then 1250 mg IV q12h VT=10-15 mg/L Daily Scr F/u cultures/levels  Height:  (180.3 cm) Weight: 210 lb 11.2 oz (95.6 kg) IBW/kg (Calculated) : 70.8  Temp (24hrs), Avg:98.6 F (37 C), Min:98.3 F (36.8 C), Max:98.9 F (37.2 C)   Recent Labs Lab 11/21/16 1927 11/21/16 1942 11/24/16 2044  WBC 7.9  --  8.1  CREATININE 0.92  --  0.88  LATICACIDVEN  --  1.19  --     Estimated Creatinine Clearance: 116.9 mL/min (by C-G formula based on SCr of 0.88 mg/dL).    No Known Allergies  Antimicrobials this admission: 10/4 zosyn >>  10/4 vancomycin >>   Dose adjustments this admission:   Microbiology results:  BCx:   UCx:    Sputum:    MRSA PCR:   Thank you for allowing pharmacy to be a part of this patient's care.  Lorenza Evangelist 11/25/2016 3:47 AM

## 2016-11-25 NOTE — Progress Notes (Signed)
NUTRITION NOTE   RD consulted for nutrition education regarding new onset diabetes.   Lab Results  Component Value Date   HGBA1C 13.3 (H) 11/25/2016    RD provided "Carbohydrate Counting for People with Diabetes" and "Diabetes Label Reading Tips" handouts from the Academy of Nutrition and Dietetics. Discussed different food groups and their effects on blood sugar, emphasizing carbohydrate-containing foods. Provided list of carbohydrates and recommended serving sizes of common foods.  Discussed importance of controlled and consistent carbohydrate intake throughout the day. Provided examples of ways to balance meals/snacks and encouraged intake of high-fiber, whole grain complex carbohydrates. Teach back method used.  Pt reports that she is [place] and that as a part of her culture she eats a large quantity of rice for meals. She states that she has been obtaining information from [employer] concerning food as it relates to new dx of DM and that she has been trying to cut back on the amount of rice she eats and also making changes, such as not waiting until she is starving to eat or not eating for a long period of time and then overdoing it when she gets off of work and eats dinner late at night. Pt likes to snack on items such as cheese and lunch meat. Talked with pt about how she can still have rice and other favorite foods at meals but to mindful of portion sizes and to make "trade-offs" in order to have items she really wants. Pt is very appreciative of this information and states that knowing she can still have favorite foods in this way makes her feel more comfortable and less overwhelmed.   Expect good compliance.  Body mass index is 29.39 kg/m. Pt meets criteria for overweight based on current BMI.  Current diet order is Healthy/Carbohydrate Modified, patient consumed 100% of breakfast this AM (omelet, grapes, breakfast potatoes, and bottled water--420 kcal and 24 grams of  protein).  Medications reviewed and include: sliding scale Novolog, 10 units of Lantus x1 dose today, 15 units of Lantus/day.  Labs reviewed; CBGs: 326, 349, and 303 mg/dL today.   No further nutrition interventions warranted at this time. RD contact information provided. If additional nutrition issues arise, please re-consult RD.    Trenton Gammon, MS, RD, LDN, [facility] Inpatient Clinical Dietitian Pager # [phone] After hours/weekend pager # [phone]

## 2016-11-25 NOTE — Progress Notes (Signed)
Inpatient Diabetes Program Recommendations  AACE/ADA: New Consensus Statement on Inpatient Glycemic Control (2015)  Target Ranges:  Prepandial:   less than 140 mg/dL      Peak postprandial:   less than 180 mg/dL (1-2 hours)      Critically ill patients:  140 - 180 mg/dL   Spoke with patient briefly. Patient just received pain medication and was drowsy and sleepy from being up last night. Discussed plan for her to watch the DM educational videos and read through the Living Well with Diabetes booklet. Will see patient tomorrow and coordinate with RN.  Thanks,  Christena Deem RN, MSN, [facility] Inpatient Diabetes Coordinator Team Pager [phone] (8a-5p)

## 2016-11-25 NOTE — Progress Notes (Signed)
Inpatient Diabetes Program Recommendations  AACE/ADA: New Consensus Statement on Inpatient Glycemic Control (2015)  Target Ranges:  Prepandial:   less than 140 mg/dL      Peak postprandial:   less than 180 mg/dL (1-2 hours)      Critically ill patients:  140 - 180 mg/dL   Results for Victoria Everett, Victoria Everett (MRN [id number]) as of 11/25/2016 08:14  Ref. Range 11/24/2016 19:22 11/25/2016 02:35 11/25/2016 07:31  Glucose-Capillary Latest Ref Range: 65 - 99 mg/dL 865 (H) 784 (H) 696 (H)   Review of Glycemic Control  Diabetes history: DM 2, Recent new diagnosis Outpatient Diabetes medications: Metformin 500 mg BID Current orders for Inpatient glycemic control: Lantus 5 units, Novolog Sensitive Correction  Inpatient Diabetes Program Recommendations:    Glucose in the 300's after Lantus 5 units given.   Please consider increasing Lantus to 15 units Daily (0.15 units/kg), consider additional 10 units this am.   Could also increase Correction scale to Novolog Moderate 0-15 units tid + Novolog HS scale.  A1c 13.3% on 11/25/16, recently diagnosed and placed on metformin.  Based on A1c may need insulin at time of d/c. Will need to start DM education while here. Will see patient today.  Thanks,  Christena Deem RN, MSN, [facility] Inpatient Diabetes Coordinator Team Pager [phone] (8a-5p)

## 2016-11-25 NOTE — H&P (Signed)
History and Physical    Victoria Everett VHQ:[id number] DOB: 06/27/1985 DOA: 11/24/2016  PCP: Patient, [name]  Patient coming from: Home.  Chief Complaint: Worsening left foot pain and swelling.  HPI: Victoria Everett is a 31 y.o. female with no significant past medical history started experiencing worsening foot pain on the left side after she stepped on a sharp object 2 weeks ago. Patient's foot started developing ulceration on the plantar aspect which gradually worsened with discharge and swelling. Patient had come to the ER a few days ago and was prescribed antibiotics and since patient also was found to be hyperglycemic was placed on metformin. Patient was advised to have outpatient wound clinic and primary care follow-up. Since patient's symptoms worsen patient came to the ER.   ED Course: On exam patient has swelling of the entire left foot with ulceration on the plantar aspect and minimal discharge. Patient's blood sugar was also elevated in the 300s but not in DKA. Patient also has elevated blood pressure. Patient was started on antibiotics for cellulitis and admitted from inpatient management.  Review of Systems: As per HPI, rest all negative.   History reviewed. No pertinent past medical history.  Past Surgical History:  Procedure Laterality Date  . TONSILLECTOMY       reports that she has been smoking.  She has never used smokeless tobacco. She reports that she drinks alcohol. Her drug history is not on file.  No Known Allergies  Family History  Problem Relation Age of Onset  . Diabetes Mellitus II Mother     Prior to Admission medications   Medication Sig Start Date End Date Taking? Authorizing Provider  cephALEXin (KEFLEX) 500 MG capsule Take 1 capsule (500 mg total) by mouth 4 (four) times daily. 11/21/16  Yes Neese, Bridgette Habermann M, NP  metFORMIN (GLUCOPHAGE) 500 MG tablet Take 1 tablet (500 mg total) by mouth 2 (two) times daily with a meal. 11/21/16  Yes Neese, [place], NP    naproxen sodium (ANAPROX) 220 MG tablet Take 440 mg by mouth 2 (two) times daily with a meal.   Yes [provider]  sulfamethoxazole-trimethoprim (BACTRIM DS,SEPTRA DS) 800-160 MG tablet Take 1 tablet by mouth 2 (two) times daily. 11/21/16 11/28/16 Yes Neese, Hope M, NP  HYDROcodone-acetaminophen (NORCO/VICODIN) 5-325 MG tablet Take 1 tablet by mouth every 6 (six) hours as needed. 11/21/16   Janne Napoleon, NP    Physical Exam: Vitals:   11/24/16 1801 11/25/16 0248  BP: (!) 145/94 (!) 161/102  Pulse: 96 88  Resp: 18 16  Temp: 98.9 F (37.2 C) 98.3 F (36.8 C)  TempSrc: Oral Oral  SpO2: 97% 99%  Weight: 95.6 kg (210 lb 11.2 oz)   Height:  (1.803 m)       Constitutional: Moderately built and nourished. Vitals:   11/24/16 1801 11/25/16 0248  BP: (!) 145/94 (!) 161/102  Pulse: 96 88  Resp: 18 16  Temp: 98.9 F (37.2 C) 98.3 F (36.8 C)  TempSrc: Oral Oral  SpO2: 97% 99%  Weight: 95.6 kg (210 lb 11.2 oz)   Height:  (1.803 m)    Eyes: Anicteric no pallor. ENMT: No discharge from the ears eyes nose and mouth. Neck: No mass felt. No neck rigidity. Respiratory: No rhonchi or crepitations. Cardiovascular: S1 and S2 heard no murmurs appreciated. Abdomen: Soft nontender bowel sounds present. Musculoskeletal: Left foot is swollen with ulceration on the plantar aspect. Skin: Plantar ulceration measuring around 3 cm on  the left foot. Neurologic: Alert awake oriented to time place and person. Moves all extremities. Psychiatric: Appears normal. Normal affect.   Labs on Admission: I have personally reviewed following labs and imaging studies  CBC:  Recent Labs Lab 11/21/16 1927 11/24/16 2044  WBC 7.9 8.1  NEUTROABS 6.3 5.2  HGB 11.9* 11.8*  HCT 35.7* 35.1*  MCV 78.6 78.5  PLT 237 313   Basic Metabolic Panel:  Recent Labs Lab 11/21/16 1927 11/24/16 2044  NA 133* 132*  K 4.1 4.5  CL 101 97*  CO2 23 25  GLUCOSE 415* 381*  BUN 15 16  CREATININE  0.92 0.88  CALCIUM 9.0 9.4   GFR: Estimated Creatinine Clearance: 116.9 mL/min (by C-G formula based on SCr of 0.88 mg/dL). Liver Function Tests:  Recent Labs Lab 11/21/16 1927  AST 16  ALT 12*  ALKPHOS 74  BILITOT 0.1*  PROT 7.2  ALBUMIN 3.3*   No results for input(s): LIPASE, AMYLASE in the last 168 hours. No results for input(s): AMMONIA in the last 168 hours. Coagulation Profile: No results for input(s): INR, PROTIME in the last 168 hours. Cardiac Enzymes: No results for input(s): CKTOTAL, CKMB, CKMBINDEX, TROPONINI in the last 168 hours. BNP (last 3 results) No results for input(s): PROBNP in the last 8760 hours. HbA1C: No results for input(s): HGBA1C in the last 72 hours. CBG:  Recent Labs Lab 11/24/16 1922 11/25/16 0235  GLUCAP 386* 326*   Lipid Profile: No results for input(s): CHOL, HDL, LDLCALC, TRIG, CHOLHDL, LDLDIRECT in the last 72 hours. Thyroid Function Tests: No results for input(s): TSH, T4TOTAL, FREET4, T3FREE, THYROIDAB in the last 72 hours. Anemia Panel: No results for input(s): VITAMINB12, FOLATE, FERRITIN, TIBC, IRON, RETICCTPCT in the last 72 hours. Urine analysis:    Component Value Date/Time   COLORURINE YELLOW 11/24/2016 2047   APPEARANCEUR HAZY (A) 11/24/2016 2047   LABSPEC 1.024 11/24/2016 2047   PHURINE 5.0 11/24/2016 2047   GLUCOSEU >=500 (A) 11/24/2016 2047   HGBUR MODERATE (A) 11/24/2016 2047   BILIRUBINUR NEGATIVE 11/24/2016 2047   KETONESUR NEGATIVE 11/24/2016 2047   PROTEINUR >=300 (A) 11/24/2016 2047   NITRITE NEGATIVE 11/24/2016 2047   LEUKOCYTESUR NEGATIVE 11/24/2016 2047   Sepsis Labs: (procalcitonin:4,lacticidven:4) )No results found for this or any previous visit (from the past 240 hour(s)).   Radiological Exams on Admission: No results found.   Assessment/Plan Active Problems:   Cellulitis of foot, left   New onset type 2 diabetes mellitus (HCC)   Elevated blood pressure reading   Normochromic  normocytic anemia   Cellulitis    1. Left foot cellulitis with diabetic foot ulceration - I have ordered MRI of the left foot. Sedimentation rate is elevated. Patient is placed on vancomycin and Zosyn. Wound team consult. Further recommendations based on MRI. 2. Diabetes mellitus type 2 new onset - check hemoglobin A1c. I have placed patient on Lantus insulin 5 units with sliding scale coverage. Patient probably can be discharged on oral anti-hyperglycemics. 3. Normocytic normochromic anemia - follow CBC. Further workup as outpatient. 4. Elevated blood pressure - for now I have placed patient on when necessary IV hydralazine for systolic blood pressure more than 160. Closely follow blood pressure trends.   DVT prophylaxis: Lovenox. Code Status: Full code.  Family Communication: Discussed with patient.  Disposition Plan: Home.  Consults called: Wound team.  Admission status: Inpatient.    Eduard Clos MD [facility] Pager [phone].  If 7PM-7AM, please contact night-coverage [email or web address] Password [email or web address]  11/25/2016, 3:38 AM

## 2016-11-25 NOTE — Progress Notes (Signed)
Patient given "living well with diabetes" book and list of videos to watch on diabetes education.  Patient instructed on how to access videos on television.  Diabetes RN to see patient tomorrow after patient has read over materials and watched videos.

## 2016-11-26 DIAGNOSIS — L97421 Non-pressure chronic ulcer of left heel and midfoot limited to breakdown of skin: Secondary | ICD-10-CM

## 2016-11-26 DIAGNOSIS — E08621 Diabetes mellitus due to underlying condition with foot ulcer: Secondary | ICD-10-CM

## 2016-11-26 DIAGNOSIS — R7989 Other specified abnormal findings of blood chemistry: Secondary | ICD-10-CM

## 2016-11-26 DIAGNOSIS — D649 Anemia, unspecified: Secondary | ICD-10-CM

## 2016-11-26 LAB — CBC
HEMATOCRIT: 30.8 % — AB (ref 36.0–46.0)
HEMOGLOBIN: 10.1 g/dL — AB (ref 12.0–15.0)
MCH: 26 pg (ref 26.0–34.0)
MCHC: 32.8 g/dL (ref 30.0–36.0)
MCV: 79.2 fL (ref 78.0–100.0)
Platelets: 260 10*3/uL (ref 150–400)
RBC: 3.89 MIL/uL (ref 3.87–5.11)
RDW: 11.8 % (ref 11.5–15.5)
WBC: 5.3 10*3/uL (ref 4.0–10.5)

## 2016-11-26 LAB — GLUCOSE, CAPILLARY
GLUCOSE-CAPILLARY: 324 mg/dL — AB (ref 65–99)
GLUCOSE-CAPILLARY: 270 mg/dL — AB (ref 65–99)
Glucose-Capillary: 247 mg/dL — ABNORMAL HIGH (ref 65–99)
Glucose-Capillary: 254 mg/dL — ABNORMAL HIGH (ref 65–99)

## 2016-11-26 LAB — BASIC METABOLIC PANEL
ANION GAP: 8 (ref 5–15)
BUN: 20 mg/dL (ref 6–20)
CALCIUM: 8.6 mg/dL — AB (ref 8.9–10.3)
CHLORIDE: 99 mmol/L — AB (ref 101–111)
CO2: 29 mmol/L (ref 22–32)
Creatinine, Ser: 1.3 mg/dL — ABNORMAL HIGH (ref 0.44–1.00)
GFR calc Af Amer: >60 mL/min (ref >60)
GFR calc non Af Amer: 54 mL/min — ABNORMAL LOW (ref >60)
GLUCOSE: 345 mg/dL — AB (ref 65–99)
Potassium: 4 mmol/L (ref 3.5–5.1)
Sodium: 136 mmol/L (ref 135–145)

## 2016-11-26 MED ORDER — INSULIN GLARGINE 100 UNIT/ML ~~LOC~~ SOLN
20.0000 [IU] | Freq: Every day | SUBCUTANEOUS | Status: DC
  Administered 2016-11-26: 20 [IU] via SUBCUTANEOUS
  Filled 2016-11-26 (×2): qty 0.2

## 2016-11-26 MED ORDER — CEFTRIAXONE SODIUM 1 G IJ SOLR
1.0000 g | INTRAMUSCULAR | Status: DC
  Administered 2016-11-26 – 2016-11-27 (×2): 1 g via INTRAVENOUS
  Filled 2016-11-26 (×3): qty 10

## 2016-11-26 MED ORDER — DIPHENHYDRAMINE HCL 25 MG PO CAPS
25.0000 mg | ORAL_CAPSULE | Freq: Four times a day (QID) | ORAL | Status: DC | PRN
  Administered 2016-11-26 – 2016-11-27 (×2): 25 mg via ORAL
  Filled 2016-11-26 (×2): qty 1

## 2016-11-26 MED ORDER — SODIUM CHLORIDE 0.9 % IV SOLN
INTRAVENOUS | Status: DC
  Administered 2016-11-26 (×2): via INTRAVENOUS

## 2016-11-26 MED ORDER — SODIUM CHLORIDE 0.9 % IV BOLUS (SEPSIS)
500.0000 mL | Freq: Once | INTRAVENOUS | Status: AC
  Administered 2016-11-26: 500 mL via INTRAVENOUS

## 2016-11-26 NOTE — Progress Notes (Signed)
[facility] PROGRESS NOTE  Victoria Everett IDC:[id number] DOB: March 23, 1985 DOA: 11/24/2016  PCP: Patient, [name]  Brief History/Interval Summary: 31 year old African-American female presented with complaints of left foot pain and swelling. She apparently stepped on a sharp object 2 weeks ago. She presented to the emergency department a few days prior to this visit, was diagnosed with cellulitis and diabetes and was discharged on oral antibiotics and metformin. Patient presented back to the emergency department with worsening symptoms. She was hospitalized for further management.  Reason for Visit: Cellulitis of the left lower extremity including foot  Consultants: None  Procedures: None  Antibiotics: Vancomycin and Zosyn  Subjective/Interval History: Patient states that she is feeling better. Swelling has improved. Less pain in the left lower extremity. Numbness in the right foot improved as well.   ROS: Denies any chest pain, shortness of breath. No nausea or vomiting.  Objective:  Vital Signs  Vitals:   11/25/16 0400 11/25/16 1325 11/25/16 2202 11/26/16 0546  BP: (!) 152/74 (!) 140/94 (!) 154/92 (!) 143/88  Pulse:  89 96 92  Resp:  16 16 14   Temp:  98.1 F (36.7 C) 98.2 F (36.8 C) 98.1 F (36.7 C)  TempSrc:  Oral Oral Oral  SpO2:  100% 97% 98%  Weight:      Height:        Intake/Output Summary (Last 24 hours) at 11/26/16 0812 Last data filed at 11/26/16 0600  Gross per 24 hour  Intake             2200 ml  Output                0 ml  Net             2200 ml   Filed Weights   11/24/16 1801  Weight: 95.6 kg (210 lb 11.2 oz)    General appearance: Awake, alert. No distress Resp: Clear to auscultation bilaterally Cardio: S1, S2 is normal, regular. No S3 so. No rubs, murmurs, or bruit GI: Abdomen is soft. Nontender, nondistended. Bowel sounds are present. No masses or organomegaly. Extremities: Swelling in the left lower extremity has improved. Less  warm to touch. No clear erythema. Ulcer noted in the plantar aspect of the left foot. No drainage noted. Good peripheral pulses.  Neurologic: Awake and alert. Oriented 3. No focal neurological deficits.  Lab Results:  Data Reviewed: I have personally reviewed following labs and imaging studies  CBC:  Recent Labs Lab 11/21/16 1927 11/24/16 2044 11/25/16 0456 11/26/16 0500  WBC 7.9 8.1 5.7 5.3  NEUTROABS 6.3 5.2  --   --   HGB 11.9* 11.8* 10.3* 10.1*  HCT 35.7* 35.1* 31.4* 30.8*  MCV 78.6 78.5 77.5* 79.2  PLT 237 313 252 875    Basic Metabolic Panel:  Recent Labs Lab 11/21/16 1927 11/24/16 2044 11/25/16 0456 11/26/16 0500  NA 133* 132* 133* 136  K 4.1 4.5 3.8 4.0  CL 101 97* 99* 99*  CO2 23 25 26 29   GLUCOSE 415* 381* 343* 345*  BUN 15 16 13 20   CREATININE 0.92 0.88 0.69 1.30*  CALCIUM 9.0 9.4 8.7* 8.6*    GFR: Estimated Creatinine Clearance: 79.1 mL/min (A) (by C-G formula based on SCr of 1.3 mg/dL (H)).  Liver Function Tests:  Recent Labs Lab 11/21/16 1927  AST 16  ALT 12*  ALKPHOS 74  BILITOT 0.1*  PROT 7.2  ALBUMIN 3.3*    HbA1C:  Recent Labs  11/25/16 0456  HGBA1C 13.3*    CBG:  Recent Labs Lab 11/25/16 0731 11/25/16 1136 11/25/16 1646 11/25/16 2205 11/26/16 0729  GLUCAP 349* 303* 196* 318* 324*    Anemia Panel:  Recent Labs  11/25/16 0456  VITAMINB12 449  FOLATE 20.5  FERRITIN 123  TIBC 259  IRON 28  RETICCTPCT 1.6     Radiology Studies: Mr Foot Left Wo Contrast  Result Date: 11/25/2016 CLINICAL DATA:  Left plantar redness and swelling after stepping on a thumb tack about a month ago. Evaluate for osteomyelitis. EXAM: MRI OF THE LEFT FOOT WITHOUT CONTRAST TECHNIQUE: Multiplanar, multisequence MR imaging of the left forefoot was performed. No intravenous contrast was administered. COMPARISON:  Left foot x-rays dated November 21, 2016. FINDINGS: Bones/Joint/Cartilage No suspicious marrow signal abnormality. No fracture  or focal bone lesion. No joint effusion. Ligaments The collateral ligaments are intact. The Lisfranc ligament is intact. Muscles and Tendons Nonspecific edema of the intrinsic muscles of the forefoot, which can be seen with diabetic muscle changes. The visualized flexor and extensor tendons are intact. Soft tissues Plantar soft tissue induration and edema beneath the fifth metatarsal head with questionable small skin defect. Prominent soft tissue swelling of the dorsum of the forefoot. No discrete drainable fluid collection. IMPRESSION: 1. Plantar soft tissue induration and edema beneath the fifth metatarsal head with questionable small skin defect. This may represent the site of prior puncture wound. No drainable fluid collection or osteomyelitis. 2. Prominent soft tissue swelling of the dorsum of the forefoot, nonspecific, but can be seen with cellulitis. Electronically Signed   By: Titus Dubin M.D.   On: 11/25/2016 08:59     Medications:  Scheduled: . enoxaparin (LOVENOX) injection  40 mg Subcutaneous Q24H  . insulin aspart  0-9 Units Subcutaneous TID WC  . insulin glargine  20 Units Subcutaneous Daily  . lisinopril  20 mg Oral Daily   Continuous: . sodium chloride     Followed by  . sodium chloride    . piperacillin-tazobactam (ZOSYN)  IV 3.375 g (11/26/16 0427)  . vancomycin Stopped (11/26/16 0744)   INO:[id number] **OR** acetaminophen, diphenhydrAMINE, hydrALAZINE, HYDROcodone-acetaminophen, HYDROmorphone (DILAUDID) injection, ondansetron **OR** ondansetron (ZOFRAN) IV, traMADol  Assessment/Plan:  Active Problems:   Cellulitis of foot, left   New onset type 2 diabetes mellitus (HCC)   Elevated blood pressure reading   Normochromic normocytic anemia   Cellulitis    Left foot cellulitis with diabetic foot ulcer MRI of the foot was performed and does not show any osteomyelitis or abscess. ESR was noted to be elevated. Patient was placed on vancomycin and Zosyn. No blood  cultures were sent. Continue IV antibiotics for now. Considering rise in creatinine consider changing her to ceftriaxone. Pain control.   Newly diagnosed diabetes mellitus type 2 with hyperglycemia/possible peripheral neuropathy HbA1c 13.3. Patient tells me that she was told that she had high glucose levels back in February. However, due to financial reason she could not get follow-up with outpatient providers and could not get started on treatment. Patient was given metformin when she was recently seen in the emergency department. CBGs remain significantly elevated. We will increase dose of Lantus. Continue sliding scale insulin coverage. Diabetes education. UA does show proteinuria. Patient was started on an ACE inhibitor yesterday. However, to elevated creatinine noted today this will be discontinued now. Patient does mention numbness in her feet, which could be suggestive of diabetic neuropathy.   Elevated creatinine Significant rise in creatinine as noted. Patient denies any issues with urination. She'll  be given IV fluids. Change her antibiotics. Stop the ACE inhibitor for now. Repeat labs tomorrow morning.  Normocytic anemia. Anemia panel reviewed. No clear deficiencies identified. This could be anemia of chronic disease.  Elevated blood pressure, likely essential hypertension Blood Pressure could be elevated due to pain as well. Patient was started on ACE inhibitor. However, due to rising creatinine this will be discontinued. May need to consider alternative agents.   DVT Prophylaxis: Lovenox    Code Status: Full code  Family Communication: Discussed with the patient  Disposition Plan: Management as outlined above. Mobilize.    LOS: 1 day   [facility] Pager [phone] 11/26/2016, 8:12 AM  If 7PM-7AM, please contact night-coverage at [email or web address], password [facility]

## 2016-11-26 NOTE — Progress Notes (Addendum)
Pharmacy Antibiotic Note  Victoria Everett is a 31 y.o. female with left foot swelling and pain admitted on 11/24/2016 with cellulitis with diabetic foot ulcer.  Pharmacy has been consulted for zosyn and vancomycin dosing.  Plan: 1) SCr elevated with >50% rise AKI possibly secondary to Vanc and Zosyn. Patient already got AM vanc dose at 0600. Will d/c vanc and check trough later this evening 2) Continue current Zosyn dosing for now 3) Recommend changing abx's if possible  Height:  (180.3 cm) Weight: 210 lb 11.2 oz (95.6 kg) IBW/kg (Calculated) : 70.8  Temp (24hrs), Avg:98.1 F (36.7 C), Min:98.1 F (36.7 C), Max:98.2 F (36.8 C)   Recent Labs Lab 11/21/16 1927 11/21/16 1942 11/24/16 2044 11/25/16 0456 11/26/16 0500  WBC 7.9  --  8.1 5.7 5.3  CREATININE 0.92  --  0.88 0.69 1.30*  LATICACIDVEN  --  1.19  --   --   --     Estimated Creatinine Clearance: 79.1 mL/min (A) (by C-G formula based on SCr of 1.3 mg/dL (H)).    No Known Allergies   Thank you for allowing pharmacy to be a part of this patient's care.   Hessie Knows, PharmD, BCPS Pager [phone] 11/26/2016 8:28 AM

## 2016-11-26 NOTE — Progress Notes (Addendum)
Inpatient Diabetes Program Recommendations  AACE/ADA: New Consensus Statement on Inpatient Glycemic Control (2015)  Target Ranges:  Prepandial:   less than 140 mg/dL      Peak postprandial:   less than 180 mg/dL (1-2 hours)      Critically ill patients:  140 - 180 mg/dL   Spoke with patient about new diabetes diagnosis.  Discussed A1C results (13.3%) and explained what an A1C is. Discussed basic pathophysiology of DM Type 2, basic home care, importance of checking CBGs and maintaining good CBG control to prevent long-term and short-term complications. Reviewed glucose and A1C goals. 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 Reviewed signs and symptoms of hyperglycemia and hypoglycemia along with treatment for both. Discussed impact of nutrition, exercise, stress, sickness, and medications on diabetes control. Briefly discussed carbohydrates and nutrition, patient feels much better about diagnosis after speaking with Dietitian. Patient has reviewed the Living Well with diabetes booklet, but has not started the videos yet. Asked patient to check her glucose at least 2 times per day (before meals and at bedtime). Informed patient that RN will be asking him to self-administer insulin to ensure proper technique and ability to administer self insulin shots.  RNs to provide ongoing basic DM education at bedside with this patient and engage patient to actively check blood glucose and administer insulin injections.   Depending on follow up in regards of medications at time of d/c. Patient will need insulin at time of d/c. May need 70/30 from [facility] if unable to get appointment at one of our clinics to get the insulin pens. Referred patient to get Glucose meter from [facility].  Thanks, Christena Deem RN, MSN, [facility] Inpatient Diabetes Coordinator Team Pager [phone] (8a-5p)

## 2016-11-27 DIAGNOSIS — I10 Essential (primary) hypertension: Secondary | ICD-10-CM

## 2016-11-27 LAB — BASIC METABOLIC PANEL
ANION GAP: 6 (ref 5–15)
BUN: 25 mg/dL — AB (ref 6–20)
CALCIUM: 8.5 mg/dL — AB (ref 8.9–10.3)
CO2: 26 mmol/L (ref 22–32)
CREATININE: 0.9 mg/dL (ref 0.44–1.00)
Chloride: 105 mmol/L (ref 101–111)
GFR calc Af Amer: >60 mL/min (ref >60)
GLUCOSE: 334 mg/dL — AB (ref 65–99)
Potassium: 4 mmol/L (ref 3.5–5.1)
Sodium: 137 mmol/L (ref 135–145)

## 2016-11-27 LAB — GLUCOSE, CAPILLARY
Glucose-Capillary: 316 mg/dL — ABNORMAL HIGH (ref 65–99)
Glucose-Capillary: 269 mg/dL — ABNORMAL HIGH (ref 65–99)
Glucose-Capillary: 239 mg/dL — ABNORMAL HIGH (ref 65–99)
Glucose-Capillary: 251 mg/dL — ABNORMAL HIGH (ref 65–99)

## 2016-11-27 MED ORDER — INSULIN GLARGINE 100 UNIT/ML ~~LOC~~ SOLN
25.0000 [IU] | Freq: Every day | SUBCUTANEOUS | Status: DC
  Administered 2016-11-27: 25 [IU] via SUBCUTANEOUS
  Filled 2016-11-27 (×2): qty 0.25

## 2016-11-27 MED ORDER — AMLODIPINE BESYLATE 5 MG PO TABS
5.0000 mg | ORAL_TABLET | Freq: Every day | ORAL | Status: DC
  Administered 2016-11-27 – 2016-11-28 (×2): 5 mg via ORAL
  Filled 2016-11-27 (×2): qty 1

## 2016-11-27 NOTE — Progress Notes (Signed)
[facility] PROGRESS NOTE  Victoria Everett MOQ:[id number] DOB: January 12, 1986 DOA: 11/24/2016  PCP: Patient, [name]  Brief History/Interval Summary: 31 year old African-American female presented with complaints of left foot pain and swelling. She apparently stepped on a sharp object 2 weeks ago. She presented to the emergency department a few days prior to this visit, was diagnosed with cellulitis and diabetes and was discharged on oral antibiotics and metformin. Patient presented back to the emergency department with worsening symptoms. She was hospitalized for further management.  Reason for Visit: Cellulitis of the left lower extremity including foot  Consultants: None  Procedures: None  Antibiotics: Vancomycin and Zosyn discontinued on 10/5. Currently on ceftriaxone  Subjective/Interval History: Patient states that she is feeling better. Her left foot and leg pain has improved. Swelling has improved as well. Denies any fever. No nausea, vomiting.   ROS: Denies any chest pain or shortness of breath  Objective:  Vital Signs  Vitals:   11/26/16 0546 11/26/16 1425 11/26/16 2115 11/27/16 0510  BP: (!) 143/88 (!) 171/113 (!) 160/91 (!) 159/79  Pulse: 92 99 100 92  Resp: 14 16 20 16   Temp: 98.1 F (36.7 C) 97.9 F (36.6 C) 98.4 F (36.9 C) 98.6 F (37 C)  TempSrc: Oral Oral Oral Oral  SpO2: 98% 99% 100% 97%  Weight:      Height:        Intake/Output Summary (Last 24 hours) at 11/27/16 0902 Last data filed at 11/27/16 0600  Gross per 24 hour  Intake          2336.25 ml  Output                0 ml  Net          2336.25 ml   Filed Weights   11/24/16 1801  Weight: 95.6 kg (210 lb 11.2 oz)    General appearance: Awake, alert. In no distress Resp: Clear to auscultation bilaterally. No wheezing, rales or rhonchi Cardio: S1, S2 is normal, regular. No S3, S4. No rubs, murmurs or bruits GI: Abdomen is soft. Nontender, nondistended. Bowel sounds are present. No  masses or organomegaly Extremities: Swelling in the left lower extremity continues to improve. Less warm to touch. Good peripheral pulses. Dry ulcer noted in the plantar aspect of the left foot without any drainage.  Neurologic: Awake and alert. Oriented 3. No focal neurological deficits.  Lab Results:  Data Reviewed: I have personally reviewed following labs and imaging studies  CBC:  Recent Labs Lab 11/21/16 1927 11/24/16 2044 11/25/16 0456 11/26/16 0500  WBC 7.9 8.1 5.7 5.3  NEUTROABS 6.3 5.2  --   --   HGB 11.9* 11.8* 10.3* 10.1*  HCT 35.7* 35.1* 31.4* 30.8*  MCV 78.6 78.5 77.5* 79.2  PLT 237 313 252 354    Basic Metabolic Panel:  Recent Labs Lab 11/21/16 1927 11/24/16 2044 11/25/16 0456 11/26/16 0500 11/27/16 0523  NA 133* 132* 133* 136 137  K 4.1 4.5 3.8 4.0 4.0  CL 101 97* 99* 99* 105  CO2 23 25 26 29 26   GLUCOSE 415* 381* 343* 345* 334*  BUN 15 16 13 20  25*  CREATININE 0.92 0.88 0.69 1.30* 0.90  CALCIUM 9.0 9.4 8.7* 8.6* 8.5*    GFR: Estimated Creatinine Clearance: 114.3 mL/min (by C-G formula based on SCr of 0.9 mg/dL).  Liver Function Tests:  Recent Labs Lab 11/21/16 1927  AST 16  ALT 12*  ALKPHOS 74  BILITOT 0.1*  PROT 7.2  ALBUMIN 3.3*    HbA1C:  Recent Labs  11/25/16 0456  HGBA1C 13.3*    CBG:  Recent Labs Lab 11/26/16 0729 11/26/16 1216 11/26/16 1734 11/26/16 2114 11/27/16 0717  GLUCAP 324* 270* 247* 254* 316*    Anemia Panel:  Recent Labs  11/25/16 0456  VITAMINB12 449  FOLATE 20.5  FERRITIN 123  TIBC 259  IRON 28  RETICCTPCT 1.6     Radiology Studies: No results found.   Medications:  Scheduled: . enoxaparin (LOVENOX) injection  40 mg Subcutaneous Q24H  . insulin aspart  0-9 Units Subcutaneous TID WC  . insulin glargine  25 Units Subcutaneous Daily   Continuous: . cefTRIAXone (ROCEPHIN)  IV Stopped (11/26/16 1348)   YQM:VHQIONGEXBMWU **OR** acetaminophen, diphenhydrAMINE, hydrALAZINE,  HYDROcodone-acetaminophen, HYDROmorphone (DILAUDID) injection, ondansetron **OR** ondansetron (ZOFRAN) IV, traMADol  Assessment/Plan:  Active Problems:   Cellulitis of foot, left   New onset type 2 diabetes mellitus (HCC)   Elevated blood pressure reading   Normochromic normocytic anemia   Cellulitis    Left foot cellulitis with diabetic foot ulcer MRI of the foot did not show any osteomyelitis or abscess. ESR was noted to be elevated. Patient was placed on vancomycin and Zosyn. No blood cultures were sent. Due to rising creatinine. Patient's antibiotics were changed over to ceftriaxone yesterday. Patient continues to feel better. Continue IV antibiotics for 1 more day. Pain is reasonably well controlled. Ambulate.  Newly diagnosed diabetes mellitus type 2 with hyperglycemia/possible peripheral neuropathy HbA1c 13.3. Patient tells me that she was told that she had high glucose levels back in February. However, due to financial reason she could not get follow-up with outpatient providers and could not get started on treatment. Patient was given metformin when she was recently seen in the emergency department. CBGs remain poorly controlled. Further increase the dose of Lantus. Due to cost issues patient will need to be discharged on 70/30 insulin. Continue sliding scale insulin coverage. Diabetes education. UA does show proteinuria. Patient was started on an ACE inhibitor however, she was noted to have elevated creatinine the following day and so it was discontinued. This can be addressed further as an outpatient. Patient does mention numbness in her feet, which could be suggestive of diabetic neuropathy.   Elevated creatinine Patient's creatinine is normal today. She has good urine output. ACE inhibitor was discontinued yesterday.   Normocytic anemia. Anemia panel reviewed. No clear deficiencies identified. This could be anemia of chronic disease.  Elevated blood pressure, likely essential  hypertension Blood pressure remains elevated despite improvement in cellulitis. She likely has underlying hypertension. ACE inhibitor would be the ideal choice. However, her creatinine did climb so I'm hesitant to use it at this time. If creatinine remains normal over the next few weeks, then another attempt can be made at initiating an ACE inhibitor. For now, we will initiate amlodipine.    DVT Prophylaxis: Lovenox    Code Status: Full code  Family Communication: Discussed with the patient  Disposition Plan: Management as outlined above. Mobilize.    LOS: 2 days   [facility] Pager [phone] 11/27/2016, 9:02 AM  If 7PM-7AM, please contact night-coverage at [email or web address], password [facility]

## 2016-11-28 LAB — BASIC METABOLIC PANEL
ANION GAP: 7 (ref 5–15)
BUN: 19 mg/dL (ref 6–20)
CALCIUM: 8.8 mg/dL — AB (ref 8.9–10.3)
CHLORIDE: 105 mmol/L (ref 101–111)
CO2: 26 mmol/L (ref 22–32)
Creatinine, Ser: 0.77 mg/dL (ref 0.44–1.00)
GFR calc Af Amer: >60 mL/min (ref >60)
GFR calc non Af Amer: >60 mL/min (ref >60)
GLUCOSE: 276 mg/dL — AB (ref 65–99)
POTASSIUM: 3.8 mmol/L (ref 3.5–5.1)
Sodium: 138 mmol/L (ref 135–145)

## 2016-11-28 LAB — GLUCOSE, CAPILLARY
Glucose-Capillary: 237 mg/dL — ABNORMAL HIGH (ref 65–99)
Glucose-Capillary: 261 mg/dL — ABNORMAL HIGH (ref 65–99)

## 2016-11-28 MED ORDER — INSULIN ASPART PROT & ASPART (70-30 MIX) 100 UNIT/ML ~~LOC~~ SUSP: 30.0000 [IU] | Freq: Two times a day (BID) | SUBCUTANEOUS | 3 refills | Status: DC

## 2016-11-28 MED ORDER — HYDROCODONE-ACETAMINOPHEN 5-325 MG PO TABS: 1.0000 | ORAL_TABLET | ORAL | 0 refills | Status: AC | PRN

## 2016-11-28 MED ORDER — INSULIN SYRINGE-NEEDLE U-100 30G 0.5 ML MISC: 2 refills | Status: AC

## 2016-11-28 MED ORDER — CEPHALEXIN 500 MG PO CAPS
500.0000 mg | ORAL_CAPSULE | Freq: Four times a day (QID) | ORAL | Status: DC
  Administered 2016-11-28: 500 mg via ORAL
  Filled 2016-11-28: qty 1

## 2016-11-28 MED ORDER — BLOOD GLUCOSE MONITOR KIT: PACK | 3 refills | Status: AC

## 2016-11-28 MED ORDER — AMLODIPINE BESYLATE 5 MG PO TABS: 5.0000 mg | ORAL_TABLET | Freq: Every day | ORAL | 0 refills | Status: DC

## 2016-11-28 MED ORDER — INSULIN ASPART PROT & ASPART (70-30 MIX) 100 UNIT/ML ~~LOC~~ SUSP
30.0000 [IU] | Freq: Two times a day (BID) | SUBCUTANEOUS | Status: DC
  Administered 2016-11-28: 30 [IU] via SUBCUTANEOUS
  Filled 2016-11-28: qty 10

## 2016-11-28 MED ORDER — DOXYCYCLINE HYCLATE 100 MG PO TABS
100.0000 mg | ORAL_TABLET | Freq: Two times a day (BID) | ORAL | Status: DC
  Filled 2016-11-28: qty 1

## 2016-11-28 MED ORDER — DOXYCYCLINE HYCLATE 100 MG PO TABS: 100.0000 mg | ORAL_TABLET | Freq: Two times a day (BID) | ORAL | 0 refills | Status: AC

## 2016-11-28 NOTE — Progress Notes (Signed)
Provided discharge teaching, education on CBG, and insulin administration, patient administered insulin, with no questions.  Patient ready for discharge.  Will continue to monitor.

## 2016-11-28 NOTE — Discharge Summary (Signed)
[facility]  Physician Discharge Summary   Patient ID: Victoria Everett MRN: [id number] DOB/AGE: [birth year] 31 y.o.  Admit date: 11/24/2016 Discharge date: 11/28/2016  PCP: Patient, [name]  DISCHARGE DIAGNOSES:  Active Problems:   Cellulitis of foot, left   New onset type 2 diabetes mellitus (HCC)   Elevated blood pressure reading   Normochromic normocytic anemia   Cellulitis   RECOMMENDATIONS FOR OUTPATIENT FOLLOW UP: 1. Patient given detailed instructions regarding diabetes management. 2. [facility]   DISCHARGE CONDITION: fair  Diet recommendation: Modified carbohydrate  Filed Weights   11/24/16 1801  Weight: 95.6 kg (210 lb 11.2 oz)    INITIAL HISTORY: 31 year old African-American female presented with complaints of left foot pain and swelling. She apparently stepped on a sharp object 2 weeks ago. She presented to the emergency department a few days prior to this visit, was diagnosed with cellulitis and diabetes and was discharged on oral antibiotics and metformin. Patient presented back to the emergency department with worsening symptoms. She was hospitalized for further management.   HOSPITAL COURSE:   Left foot cellulitis with diabetic foot ulcer MRI of the foot did not show any osteomyelitis or abscess. ESR was noted to be elevated. Patient was placed initially on vancomycin and Zosyn. No blood cultures were sent. Due to rising creatinine patient's antibiotics were changed over to ceftriaxone. Patient continues to feel better. Pain and swelling has subsided. Patient transitioned to oral antibiotics today including Keflex and doxycycline. She has been ambulating without any difficulties. Wishes to go home. Okay for discharge.   Newly diagnosed diabetes mellitus type 2 with hyperglycemia/possible peripheral neuropathy HbA1c 13.3. Patient tells me that she was told that she had high glucose  levels back in February. However, due to financial reason she could not get follow-up with outpatient providers and could not get started on treatment. Patient was given metformin when she was recently seen in the emergency department. CBGs were quite elevated when she was hospitalized. She was started on Lantus. Dose was adjusted based on her CBGs. Due to cost issues patient will need to be discharged on 70/30 insulin. She was provided with diabetes education. She was given teaching regarding administration of insulin. UA does show proteinuria. Patient was started on an ACE inhibitor however, she was noted to have elevated creatinine the following day and so it was discontinued to avoid any further worsening. Unlikely that the rise in creatinine was due to the ACE inhibitor. However, this can be further addressed as an outpatient. Patient does mention numbness in her feet, which could be suggestive of diabetic neuropathy.   Elevated creatinine This was transient. With IVF this has improved. She has good urine output. ACE inhibitor was discontinued , but could be tried again as an outpatient..   Normocytic anemia. Anemia panel reviewed. No clear deficiencies identified. This could be anemia of chronic disease.  Essential hypertension Blood pressure remains elevated despite improvement in cellulitis. She likely has underlying hypertension. ACE inhibitor would be the ideal choice. However, her creatinine did climb so I'm hesitant to use it at this time. If creatinine remains normal over the next few weeks, then another attempt can be made at initiating an ACE inhibitor. For now, we will initiate amlodipine.    Overall, stable. Okay for discharge home.    PERTINENT LABS:  The results of significant diagnostics from this hospitalization (including imaging, microbiology, ancillary and laboratory) are listed below for reference.  Labs: Basic Metabolic Panel:  Recent Labs Lab 11/24/16 2044  11/25/16 0456 11/26/16 0500 11/27/16 0523 11/28/16 0520  NA 132* 133* 136 137 138  K 4.5 3.8 4.0 4.0 3.8  CL 97* 99* 99* 105 105  CO2 25 26 29 26 26   GLUCOSE 381* 343* 345* 334* 276*  BUN 16 13 20  25* 19  CREATININE 0.88 0.69 1.30* 0.90 0.77  CALCIUM 9.4 8.7* 8.6* 8.5* 8.8*   Liver Function Tests:  Recent Labs Lab 11/21/16 1927  AST 16  ALT 12*  ALKPHOS 74  BILITOT 0.1*  PROT 7.2  ALBUMIN 3.3*   CBC:  Recent Labs Lab 11/21/16 1927 11/24/16 2044 11/25/16 0456 11/26/16 0500  WBC 7.9 8.1 5.7 5.3  NEUTROABS 6.3 5.2  --   --   HGB 11.9* 11.8* 10.3* 10.1*  HCT 35.7* 35.1* 31.4* 30.8*  MCV 78.6 78.5 77.5* 79.2  PLT 237 313 252 260   CBG:  Recent Labs Lab 11/27/16 1209 11/27/16 1741 11/27/16 2042 11/28/16 0304 11/28/16 0728  GLUCAP 269* 239* 251* 237* 261*     IMAGING STUDIES Mr Foot Left Wo Contrast  Result Date: 11/25/2016 CLINICAL DATA:  Left plantar redness and swelling after stepping on a thumb tack about a month ago. Evaluate for osteomyelitis. EXAM: MRI OF THE LEFT FOOT WITHOUT CONTRAST TECHNIQUE: Multiplanar, multisequence MR imaging of the left forefoot was performed. No intravenous contrast was administered. COMPARISON:  Left foot x-rays dated November 21, 2016. FINDINGS: Bones/Joint/Cartilage No suspicious marrow signal abnormality. No fracture or focal bone lesion. No joint effusion. Ligaments The collateral ligaments are intact. The Lisfranc ligament is intact. Muscles and Tendons Nonspecific edema of the intrinsic muscles of the forefoot, which can be seen with diabetic muscle changes. The visualized flexor and extensor tendons are intact. Soft tissues Plantar soft tissue induration and edema beneath the fifth metatarsal head with questionable small skin defect. Prominent soft tissue swelling of the dorsum of the forefoot. No discrete drainable fluid collection. IMPRESSION: 1. Plantar soft tissue induration and edema beneath the fifth metatarsal head  with questionable small skin defect. This may represent the site of prior puncture wound. No drainable fluid collection or osteomyelitis. 2. Prominent soft tissue swelling of the dorsum of the forefoot, nonspecific, but can be seen with cellulitis. Electronically Signed   By: Titus Dubin M.D.   On: 11/25/2016 08:59   Dg Foot 2 Views Left  Result Date: 11/21/2016 CLINICAL DATA:  Stepped on attack 2 weeks ago. Now with recent fever and swelling EXAM: LEFT FOOT - 2 VIEW COMPARISON:  None. FINDINGS: There is no evidence of fracture or dislocation. There is no evidence of arthropathy or other focal bone abnormality. Mild diffuse soft tissue swelling. IMPRESSION: 1. No acute bone abnormality. 2. Diffuse soft tissue swelling. Electronically Signed   By: Kerby Moors M.D.   On: 11/21/2016 21:49    DISCHARGE EXAMINATION: Vitals:   11/27/16 0510 11/27/16 1430 11/27/16 2039 11/28/16 0556  BP: (!) 159/79 (!) 147/84 (!) 155/85 (!) 130/95  Pulse: 92 98 91 91  Resp: 16 16 16 16   Temp: 98.6 F (37 C) 98.4 F (36.9 C) 98.5 F (36.9 C) 98.6 F (37 C)  TempSrc: Oral  Oral Oral  SpO2: 97% 96% 98% 98%  Weight:      Height:       General appearance: alert, cooperative, appears stated age and no distress Resp: clear to auscultation bilaterally Cardio: regular rate and rhythm, S1, S2 normal, no murmur, click, rub  or gallop GI: soft, non-tender; bowel sounds normal; no masses,  no organomegaly Extremities: Left lower extremity and left foot, much improved. They are much less swollen compared to admission. Good peripheral pulses. Nontender. The ulcer in the plantar aspect of the foot is dry without any drainage.  DISPOSITION: Home  Discharge Instructions    Call MD for:  extreme fatigue    Complete by:  As directed    Call MD for:  persistant dizziness or light-headedness    Complete by:  As directed    Call MD for:  persistant nausea and vomiting    Complete by:  As directed    Call MD for:   severe uncontrolled pain    Complete by:  As directed    Call MD for:  temperature >100.4    Complete by:  As directed    Diet Carb Modified    Complete by:  As directed    Discharge instructions    Complete by:  As directed    Please be sure to keep your appointment with the [facility]. Please check your blood glucose levels at least 3 times a day before each meal and keep a log for your providers. Taking her medications regularly. If you see a blood glucose value of less than 80, please contact a provider immediately. Also, if you notice a blood glucose value greater than 300, please do the same. Stick to the diabetic diet. Keep your feet dry and clean. Especially between the toes. Check on the foot ulcer daily and if you notice worsening pain or drainage, please seek attention immediately. BP on left leg elevated as much as possible. You may return to work on Tuesday, October 9.  You were cared for by a hospitalist during your hospital stay. If you have any questions about your discharge medications or the care you received while you were in the hospital after you are discharged, you can call the unit and asked to speak with the hospitalist on call if the hospitalist that took care of you is not available. Once you are discharged, your primary care physician will handle any further medical issues. Please note that NO REFILLS for any discharge medications will be authorized once you are discharged, as it is imperative that you return to your primary care physician (or establish a relationship with a primary care physician if you do not have one) for your aftercare needs so that they can reassess your need for medications and monitor your lab values. If you do not have a primary care physician, you can call [phone] for a physician referral.   Increase activity slowly    Complete by:  As directed       ALLERGIES: No Known Allergies    Discharge Medication List as of  11/28/2016 11:59 AM    START taking these medications   Details  amLODipine (NORVASC) 5 MG tablet Take 1 tablet (5 mg total) by mouth daily., Starting Sun 11/28/2016, Print    blood glucose meter kit and supplies KIT Dispense based on patient and insurance preference. Use up to four times daily as directed. (FOR ICD-9 250.00, 250.01)., Print    doxycycline (VIBRA-TABS) 100 MG tablet Take 1 tablet (100 mg total) by mouth every 12 (twelve) hours., Starting Sun 11/28/2016, Until Wed 12/08/2016, Print    insulin aspart protamine- aspart (NOVOLOG MIX 70/30) (70-30) 100 UNIT/ML injection Inject 0.3 mLs (30 Units total) into the skin 2 (two) times daily with a  meal., Starting Sun 11/28/2016, Print    Insulin Syringe-Needle U-100 30G 0.5 ML MISC Use as directed, Print      CONTINUE these medications which have CHANGED   Details  HYDROcodone-acetaminophen (NORCO/VICODIN) 5-325 MG tablet Take 1 tablet by mouth every 4 (four) hours as needed for severe pain., Starting Sun 11/28/2016, Print      CONTINUE these medications which have NOT CHANGED   Details  cephALEXin (KEFLEX) 500 MG capsule Take 1 capsule (500 mg total) by mouth 4 (four) times daily., Starting Sun 11/21/2016, Print    metFORMIN (GLUCOPHAGE) 500 MG tablet Take 1 tablet (500 mg total) by mouth 2 (two) times daily with a meal., Starting Sun 11/21/2016, Print      STOP taking these medications     naproxen sodium (ANAPROX) 220 MG tablet      sulfamethoxazole-trimethoprim (BACTRIM DS,SEPTRA DS) 800-160 MG tablet          Follow-up Information    [facility] up.   Why:  please call to arrange appointment Contact information: [address] [place] [address] [phone]          TOTAL DISCHARGE TIME: 54 minutes  [facility] Pager [phone]  11/28/2016, 1:42 PM

## 2016-11-28 NOTE — Discharge Instructions (Signed)
Diabetes and Foot Care Diabetes may cause you to have problems because of poor blood supply (circulation) to your feet and legs. This may cause the skin on your feet to become thinner, break easier, and heal more slowly. Your skin may become dry, and the skin may peel and crack. You may also have nerve damage in your legs and feet causing decreased feeling in them. You may not notice minor injuries to your feet that could lead to infections or more serious problems. Taking care of your feet is one of the most important things you can do for yourself. Follow these instructions at home:  Wear shoes at all times, even in the house. Do not go barefoot. Bare feet are easily injured.  Check your feet daily for blisters, cuts, and redness. If you cannot see the bottom of your feet, use a mirror or ask someone for help.  Wash your feet with warm water (do not use hot water) and mild soap. Then pat your feet and the areas between your toes until they are completely dry. Do not soak your feet as this can dry your skin.  Apply a moisturizing lotion or petroleum jelly (that does not contain alcohol and is unscented) to the skin on your feet and to dry, brittle toenails. Do not apply lotion between your toes.  Trim your toenails straight across. Do not dig under them or around the cuticle. File the edges of your nails with an emery board or nail file.  Do not cut corns or calluses or try to remove them with medicine.  Wear clean socks or stockings every day. Make sure they are not too tight. Do not wear knee-high stockings since they may decrease blood flow to your legs.  Wear shoes that fit properly and have enough cushioning. To break in new shoes, wear them for just a few hours a day. This prevents you from injuring your feet. Always look in your shoes before you put them on to be sure there are no objects inside.  Do not cross your legs. This may decrease the blood flow to your feet.  If you find a  minor scrape, cut, or break in the skin on your feet, keep it and the skin around it clean and dry. These areas may be cleansed with mild soap and water. Do not cleanse the area with peroxide, alcohol, or iodine.  When you remove an adhesive bandage, be sure not to damage the skin around it.  If you have a wound, look at it several times a day to make sure it is healing.  Do not use heating pads or hot water bottles. They may burn your skin. If you have lost feeling in your feet or legs, you may not know it is happening until it is too late.  Make sure your health care provider performs a complete foot exam at least annually or more often if you have foot problems. Report any cuts, sores, or bruises to your health care provider immediately. Contact a health care provider if:  You have an injury that is not healing.  You have cuts or breaks in the skin.  You have an ingrown nail.  You notice redness on your legs or feet.  You feel burning or tingling in your legs or feet.  You have pain or cramps in your legs and feet.  Your legs or feet are numb.  Your feet always feel cold. Get help right away if:  There is increasing  redness, swelling, or pain in or around a wound.  There is a red line that goes up your leg.  Pus is coming from a wound.  You develop a fever or as directed by your health care provider.  You notice a bad smell coming from an ulcer or wound. This information is not intended to replace advice given to you by your health care provider. Make sure you discuss any questions you have with your health care provider. Document Released: 02/06/2000 Document Revised: 07/17/2015 Document Reviewed: 07/18/2012 Elsevier Interactive Patient Education  2017 [employer]   Blood Glucose Monitoring, Adult Monitoring your blood sugar (glucose) helps you manage your diabetes. It also helps you and your health care provider determine how well your diabetes management plan is  working. Blood glucose monitoring involves checking your blood glucose as often as directed, and keeping a record (log) of your results over time. Why should I monitor my blood glucose? Checking your blood glucose regularly can:  Help you understand how food, exercise, illnesses, and medicines affect your blood glucose.  Let you know what your blood glucose is at any time. You can quickly tell if you are having low blood glucose (hypoglycemia) or high blood glucose (hyperglycemia).  Help you and your health care provider adjust your medicines as needed.  When should I check my blood glucose? Follow instructions from your health care provider about how often to check your blood glucose. This may depend on:  The type of diabetes you have.  How well-controlled your diabetes is.  Medicines you are taking.  If you have type 1 diabetes:  Check your blood glucose at least 2 times a day.  Also check your blood glucose: ? Before every insulin injection. ? Before and after exercise. ? Between meals. ? 2 hours after a meal. ? Occasionally between 2:00 a.m. and 3:00 a.m., as directed. ? Before potentially dangerous tasks, like driving or using heavy machinery. ? At bedtime.  You may need to check your blood glucose more often, up to 6-10 times a day: ? If you use an insulin pump. ? If you need multiple daily injections (MDI). ? If your diabetes is not well-controlled. ? If you are ill. ? If you have a history of severe hypoglycemia. ? If you have a history of not knowing when your blood glucose is getting low (hypoglycemia unawareness). If you have type 2 diabetes:  If you take insulin or other diabetes medicines, check your blood glucose at least 2 times a day.  If you are on intensive insulin therapy, check your blood glucose at least 4 times a day. Occasionally, you may also need to check between 2:00 a.m. and 3:00 a.m., as directed.  Also check your blood glucose: ? Before and  after exercise. ? Before potentially dangerous tasks, like driving or using heavy machinery.  You may need to check your blood glucose more often if: ? Your medicine is being adjusted. ? Your diabetes is not well-controlled. ? You are ill. What is a blood glucose log?  A blood glucose log is a record of your blood glucose readings. It helps you and your health care provider: ? Look for patterns in your blood glucose over time. ? Adjust your diabetes management plan as needed.  Every time you check your blood glucose, write down your result and notes about things that may be affecting your blood glucose, such as your diet and exercise for the day.  Most glucose meters store a record  of glucose readings in the meter. Some meters allow you to download your records to a computer. How do I check my blood glucose? Follow these steps to get accurate readings of your blood glucose: Supplies needed   Blood glucose meter.  Test strips for your meter. Each meter has its own strips. You must use the strips that come with your meter.  A needle to prick your finger (lancet). Do not use lancets more than once.  A device that holds the lancet (lancing device).  A journal or log book to write down your results. Procedure  Wash your hands with soap and water.  Prick the side of your finger (not the tip) with the lancet. Use a different finger each time.  Gently rub the finger until a small drop of blood appears.  Follow instructions that come with your meter for inserting the test strip, applying blood to the strip, and using your blood glucose meter.  Write down your result and any notes. Alternative testing sites  Some meters allow you to use areas of your body other than your finger (alternative sites) to test your blood.  If you think you may have hypoglycemia, or if you have hypoglycemia unawareness, do not use alternative sites. Use your finger instead.  Alternative sites may not  be as accurate as the fingers, because blood flow is slower in these areas. This means that the result you get may be delayed, and it may be different from the result that you would get from your finger.  The most common alternative sites are: ? Forearm. ? Thigh. ? Palm of the hand. Additional tips  Always keep your supplies with you.  If you have questions or need help, all blood glucose meters have a 24-hour hotline number that you can call. You may also contact your health care provider.  After you use a few boxes of test strips, adjust (calibrate) your blood glucose meter by following instructions that came with your meter. This information is not intended to replace advice given to you by your health care provider. Make sure you discuss any questions you have with your health care provider. Document Released: 02/11/2003 Document Revised: 08/29/2015 Document Reviewed: 07/21/2015 Elsevier Interactive Patient Education  2017 [employer] Inc.  Diabetes Mellitus and Food It is important for you to manage your blood sugar (glucose) level. Your blood glucose level can be greatly affected by what you eat. Eating healthier foods in the appropriate amounts throughout the day at about the same time each day will help you control your blood glucose level. It can also help slow or prevent worsening of your diabetes mellitus. Healthy eating may even help you improve the level of your blood pressure and reach or maintain a healthy weight. General recommendations for healthful eating and cooking habits include:  Eating meals and snacks regularly. Avoid going long periods of time without eating to lose weight.  Eating a diet that consists mainly of plant-based foods, such as fruits, vegetables, nuts, legumes, and whole grains.  Using low-heat cooking methods, such as baking, instead of high-heat cooking methods, such as deep frying.  Work with your dietitian to make sure you understand how to use the  Nutrition Facts information on food labels. How can food affect me? Carbohydrates Carbohydrates affect your blood glucose level more than any other type of food. Your dietitian will help you determine how many carbohydrates to eat at each meal and teach you how to count carbohydrates. Counting carbohydrates is important  to keep your blood glucose at a healthy level, especially if you are using insulin or taking certain medicines for diabetes mellitus. Alcohol Alcohol can cause sudden decreases in blood glucose (hypoglycemia), especially if you use insulin or take certain medicines for diabetes mellitus. Hypoglycemia can be a life-threatening condition. Symptoms of hypoglycemia (sleepiness, dizziness, and disorientation) are similar to symptoms of having too much alcohol. If your health care provider has given you approval to drink alcohol, do so in moderation and use the following guidelines:  Women should not have more than one drink per day, and men should not have more than two drinks per day. One drink is equal to: ? 12 oz of beer. ? 5 oz of wine. ? 1 oz of hard liquor.  Do not drink on an empty stomach.  Keep yourself hydrated. Have water, diet soda, or unsweetened iced tea.  Regular soda, juice, and other mixers might contain a lot of carbohydrates and should be counted.  What foods are not recommended? As you make food choices, it is important to remember that all foods are not the same. Some foods have fewer nutrients per serving than other foods, even though they might have the same number of calories or carbohydrates. It is difficult to get your body what it needs when you eat foods with fewer nutrients. Examples of foods that you should avoid that are high in calories and carbohydrates but low in nutrients include:  Trans fats (most processed foods list trans fats on the Nutrition Facts label).  Regular soda.  Juice.  Candy.  Sweets, such as cake, pie, doughnuts, and  cookies.  Fried foods.  What foods can I eat? Eat nutrient-rich foods, which will nourish your body and keep you healthy. The food you should eat also will depend on several factors, including:  The calories you need.  The medicines you take.  Your weight.  Your blood glucose level.  Your blood pressure level.  Your cholesterol level.  You should eat a variety of foods, including:  Protein. ? Lean cuts of meat. ? Proteins low in saturated fats, such as fish, egg whites, and beans. Avoid processed meats.  Fruits and vegetables. ? Fruits and vegetables that may help control blood glucose levels, such as apples, mangoes, and yams.  Dairy products. ? Choose fat-free or low-fat dairy products, such as milk, yogurt, and cheese.  Grains, bread, pasta, and rice. ? Choose whole grain products, such as multigrain bread, whole oats, and brown rice. These foods may help control blood pressure.  Fats. ? Foods containing healthful fats, such as nuts, avocado, olive oil, canola oil, and fish.  Does everyone with diabetes mellitus have the same meal plan? Because every person with diabetes mellitus is different, there is not one meal plan that works for everyone. It is very important that you meet with a dietitian who will help you create a meal plan that is just right for you. This information is not intended to replace advice given to you by your health care provider. Make sure you discuss any questions you have with your health care provider. Document Released: 11/05/2004 Document Revised: 07/17/2015 Document Reviewed: 01/05/2013 Elsevier Interactive Patient Education  2017 [employer].

## 2016-11-29 ENCOUNTER — Telehealth: Payer: Self-pay

## 2016-11-29 NOTE — Telephone Encounter (Signed)
Message received from Eldridge Abrahams, RN CM requesting a hospital follow up appointment at [facility] for the patient  Spoke to the patient and an appointment was scheduled for 12/10/16 @ 13330.   Update provided to Breck Coons, RN CM

## 2016-12-06 ENCOUNTER — Ambulatory Visit: Payer: Self-pay | Admitting: Surgery

## 2016-12-08 ENCOUNTER — Encounter: Payer: Self-pay | Attending: Internal Medicine | Admitting: Internal Medicine

## 2016-12-08 DIAGNOSIS — G629 Polyneuropathy, unspecified: Secondary | ICD-10-CM | POA: Insufficient documentation

## 2016-12-08 DIAGNOSIS — Z794 Long term (current) use of insulin: Secondary | ICD-10-CM | POA: Insufficient documentation

## 2016-12-08 DIAGNOSIS — I10 Essential (primary) hypertension: Secondary | ICD-10-CM | POA: Insufficient documentation

## 2016-12-08 DIAGNOSIS — L97528 Non-pressure chronic ulcer of other part of left foot with other specified severity: Secondary | ICD-10-CM | POA: Insufficient documentation

## 2016-12-08 DIAGNOSIS — E11621 Type 2 diabetes mellitus with foot ulcer: Secondary | ICD-10-CM | POA: Insufficient documentation

## 2016-12-09 NOTE — Progress Notes (Signed)
Victoria Everett, Victoria Everett ([id number]) Visit Report for 12/08/2016 Abuse/Suicide Risk Screen Details Patient Name: Victoria Everett, Victoria Everett Date of Service: 12/08/2016 8:00 AM Medical Record Patient Account Number: [id number] Number: Treating RN: Huel Coventry [birth year] (31 y.o. Other Clinician: Date of Birth/Sex: Female) Treating Victoria Everett Primary Care Victoria Everett: PATIENT, NO Victoria Everett/Extender: G Referring Victoria Everett: Victoria Everett Weeks in Treatment: 0 Abuse/Suicide Risk Screen Items Answer ABUSE/SUICIDE RISK SCREEN: Has anyone close to you tried to hurt or harm you recentlyo No Do you feel uncomfortable with anyone in your familyo No Has anyone forced you do things that you didnot want to doo No Do you have any thoughts of harming yourselfo No Patient displays signs or symptoms of abuse and/or neglect. No Electronic Signature(s) Signed: 12/08/2016 5:39:47 PM By: Elliot Gurney, BSN, RN, CWS, Kim RN, BSN Entered By: Elliot Gurney, BSN, RN, CWS, Kim on 12/08/2016 08:36:35 Victoria Everett ([id number]) -------------------------------------------------------------------------------- Activities of Daily Living Details Patient Name: Victoria Everett Date of Service: 12/08/2016 8:00 AM Medical Record Patient Account Number: [id number] Number: Treating RN: Huel Coventry 06-25-1985 (31 y.o. Other Clinician: Date of Birth/Sex: Female) Treating Victoria Everett Primary Care Victoria Everett: PATIENT, NO Victoria Everett/Extender: G Referring Tynlee Bayle: Victoria Everett Weeks in Treatment: 0 Activities of Daily Living Items Answer Activities of Daily Living (Please select one for each item) Drive Automobile Completely Able Take Medications Completely Able Use Telephone Completely Able Care for Appearance Completely Able Use Toilet Completely Able Bath / Shower Completely Able Dress Self Completely Able Feed Self Completely Able Walk Completely Able Get In / Out Bed Completely Able Housework Completely  Able Prepare Meals Completely Able Handle Money Completely Able Shop for Self Completely Able Electronic Signature(s) Signed: 12/08/2016 5:39:47 PM By: Elliot Gurney, BSN, RN, CWS, Kim RN, BSN Entered By: Elliot Gurney, BSN, RN, CWS, Kim on 12/08/2016 08:36:49 Victoria Everett ([id number]) -------------------------------------------------------------------------------- Education Assessment Details Patient Name: Victoria Everett Date of Service: 12/08/2016 8:00 AM Medical Record Patient Account Number: [id number] Number: Treating RN: Huel Coventry 07-25-1985 (31 y.o. Other Clinician: Date of Birth/Sex: Female) Treating Victoria Everett Primary Care Victoria Everett: PATIENT, NO Victoria Everett/Extender: G Referring Victoria Everett: Victoria Everett in Treatment: 0 Primary Learner Assessed: Patient Learning Preferences/Education Level/Primary Language Learning Preference: Explanation, Demonstration Highest Education Level: College or Above Preferred Language: English Cognitive Barrier Assessment/Beliefs Language Barrier: No Translator Needed: No Memory Deficit: No Emotional Barrier: No Physical Barrier Assessment Impaired Vision: No Impaired Hearing: No Decreased Hand dexterity: No Knowledge/Comprehension Assessment Knowledge Level: High Comprehension Level: High Ability to understand written High instructions: Ability to understand verbal High instructions: Motivation Assessment Anxiety Level: Calm Cooperation: Cooperative Education Importance: Acknowledges Need Interest in Health Problems: Asks Questions Perception: Coherent Willingness to Engage in Self- High Management Activities: Readiness to Engage in Self- High Management Activities: Electronic Signature(s) Victoria Everett, Victoria Everett ([id number]) Signed: 12/08/2016 5:39:47 PM By: Elliot Gurney, BSN, RN, CWS, Kim RN, BSN Entered By: Elliot Gurney, BSN, RN, CWS, Kim on 12/08/2016 08:37:12 Victoria Everett, Victoria Everett  ([id number]) -------------------------------------------------------------------------------- Fall Risk Assessment Details Patient Name: Victoria Everett Date of Service: 12/08/2016 8:00 AM Medical Record Patient Account Number: [id number] Number: Treating RN: Huel Coventry 1985-10-27 (31 y.o. Other Clinician: Date of Birth/Sex: Female) Treating Victoria Everett Primary Care Issabella Rix: PATIENT, NO Victoria Everett/Extender: G Referring Victoria Everett: Victoria Everett Weeks in Treatment: 0 Fall Risk Assessment Items Have you had 2 or more falls in the last 12 monthso 0 Yes Have you had any fall that resulted in injury in the last 12 monthso 0 Yes FALL RISK ASSESSMENT: History of falling - immediate or within 3 months 0 No Secondary  diagnosis 0 No Ambulatory aid None/bed rest/wheelchair/nurse 0 Yes Crutches/cane/walker 0 No Furniture 0 No IV Access/Saline Lock 0 No Gait/Training Normal/bed rest/immobile 0 Yes Weak 0 No Impaired 0 No Mental Status Oriented to own ability 0 Yes Electronic Signature(s) Signed: 12/08/2016 5:39:47 PM By: Elliot GurneyWoody, BSN, RN, CWS, Kim RN, BSN Entered By: Elliot GurneyWoody, BSN, RN, CWS, Kim on 12/08/2016 08:37:27 Victoria Everett ([id number]) -------------------------------------------------------------------------------- Foot Assessment Details Patient Name: Victoria Everett Date of Service: 12/08/2016 8:00 AM Medical Record Patient Account Number: [id number] Number: Treating RN: Huel CoventryWoody, Kim [birth year] (31 y.o. Other Clinician: Date of Birth/Sex: Female) Treating Victoria Everett Primary Care Corneilus Heggie: PATIENT, NO Donnivan Villena/Extender: G Referring Lando Alcalde: Victoria MiniumKAKRAKANDY, Victoria Everett Weeks in Treatment: 0 Foot Assessment Items Site Locations + = Sensation present, - = Sensation absent, C = Callus, U = Ulcer R = Redness, W = Warmth, M = Maceration, PU = Pre-ulcerative lesion F = Fissure, S = Swelling, D = Dryness Assessment Right: Left: Other Deformity: No No Prior  Foot Ulcer: No No Prior Amputation: No No Charcot Joint: No No Ambulatory Status: Ambulatory Without Help Gait: Steady Electronic Signature(s) Signed: 12/08/2016 5:39:47 PM By: Elliot GurneyWoody, BSN, RN, CWS, Kim RN, BSN Entered By: Elliot GurneyWoody, BSN, RN, CWS, Kim on 12/08/2016 08:39:11 Victoria Everett ([id number]) -------------------------------------------------------------------------------- Nutrition Risk Assessment Details Patient Name: Victoria Everett Date of Service: 12/08/2016 8:00 AM Medical Record Patient Account Number: [id number] Number: Treating RN: Huel CoventryWoody, Kim [birth year] (31 y.o. Other Clinician: Date of Birth/Sex: Female) Treating Victoria Everett Primary Care Samiel Peel: PATIENT, NO Devlon Dosher/Extender: G Referring Brennen Camper: Victoria MiniumKAKRAKANDY, Victoria Everett Weeks in Treatment: 0 Height (in): 71 Weight (lbs): 243 Body Mass Index (BMI): 33.9 Nutrition Risk Assessment Items NUTRITION RISK SCREEN: I have an illness or condition that made me change the kind and/or 0 No amount of food I eat I eat fewer than two meals per day 0 No I eat few fruits and vegetables, or milk products 0 No I have three or more drinks of beer, liquor or wine almost every day 0 No I have tooth or mouth problems that make it hard for me to eat 0 No I don't always have enough money to buy the food I need 0 No I eat alone most of the time 0 No I take three or more different prescribed or over-the-counter drugs a 0 No day Without wanting to, I have lost or gained 10 pounds in the last six 0 No months I am not always physically able to shop, cook and/or feed myself 0 No Nutrition Protocols Good Risk Protocol Provide education on elevated blood sugars Moderate Risk Protocol 0 and impact on wound healing, as applicable Electronic Signature(s) Signed: 12/08/2016 5:39:47 PM By: Elliot GurneyWoody, BSN, RN, CWS, Kim RN, BSN Entered By: Elliot GurneyWoody, BSN, RN, CWS, Kim on 12/08/2016 08:37:44

## 2016-12-10 ENCOUNTER — Ambulatory Visit: Payer: Self-pay | Attending: Family Medicine | Admitting: Physician Assistant

## 2016-12-10 VITALS — BP 161/94 | HR 80 | Temp 98.2°F | Resp 18 | Ht 71.0 in | Wt 243.4 lb

## 2016-12-10 DIAGNOSIS — Z833 Family history of diabetes mellitus: Secondary | ICD-10-CM | POA: Insufficient documentation

## 2016-12-10 DIAGNOSIS — R809 Proteinuria, unspecified: Secondary | ICD-10-CM | POA: Insufficient documentation

## 2016-12-10 DIAGNOSIS — N179 Acute kidney failure, unspecified: Secondary | ICD-10-CM | POA: Insufficient documentation

## 2016-12-10 DIAGNOSIS — E119 Type 2 diabetes mellitus without complications: Secondary | ICD-10-CM | POA: Insufficient documentation

## 2016-12-10 DIAGNOSIS — F17200 Nicotine dependence, unspecified, uncomplicated: Secondary | ICD-10-CM | POA: Insufficient documentation

## 2016-12-10 DIAGNOSIS — M7989 Other specified soft tissue disorders: Secondary | ICD-10-CM | POA: Insufficient documentation

## 2016-12-10 DIAGNOSIS — Z794 Long term (current) use of insulin: Secondary | ICD-10-CM | POA: Insufficient documentation

## 2016-12-10 DIAGNOSIS — I10 Essential (primary) hypertension: Secondary | ICD-10-CM | POA: Insufficient documentation

## 2016-12-10 LAB — GLUCOSE, POCT (MANUAL RESULT ENTRY): POC Glucose: 101 mg/dl — AB (ref 70–99)

## 2016-12-10 MED ORDER — INSULIN ASPART PROT & ASPART (70-30 MIX) 100 UNIT/ML ~~LOC~~ SUSP: 30.0000 [IU] | Freq: Two times a day (BID) | SUBCUTANEOUS | 3 refills | Status: AC

## 2016-12-10 MED ORDER — LISINOPRIL-HYDROCHLOROTHIAZIDE 10-12.5 MG PO TABS: 1.0000 | ORAL_TABLET | Freq: Every day | ORAL | 3 refills | Status: DC

## 2016-12-10 MED FILL — !NOVOLOG MIX 70/30 VIAL: 70-30/ML | 33 days supply | Qty: 20 | Fill #0

## 2016-12-10 MED FILL — LISINOPRIL-HCTZ 10-12.5 MG: 10-12.5 | 30 days supply | Qty: 30 | Fill #0

## 2016-12-10 NOTE — Patient Instructions (Signed)
Aim for 30 minutes of exercise most days. Rethink what you drink. Water is great! Aim for 2-3 Carb Choices per meal (30-45 grams) +/- 1 either way  Aim for 0-15 Carbs per snack if hungry  Include protein in moderation with your meals and snacks  Consider reading food labels for Total Carbohydrate and Fat Grams of foods  Consider checking BG at alternate times per day  Continue taking medication as directed Be mindful about how much sugar you are adding to beverages and other foods. Fruit Punch - find one with no sugar  Measure and decrease portions of carbohydrate foods  Make your plate and don't go back for seconds  Stop smoking :-)

## 2016-12-10 NOTE — Progress Notes (Signed)
Patient is here for f/up  

## 2016-12-10 NOTE — Progress Notes (Signed)
ANNASTASIA, HASKINS ([id number]) Visit Report for 12/08/2016 Chief Complaint Document Details Patient Name: Victoria Everett, Victoria Everett Date of Service: 12/08/2016 8:00 AM Medical Record Patient Account Number: [id number] Number: Treating RN: Huel Coventry 07-Sep-1985 (31 y.o. Other Clinician: Date of Birth/Sex: Female) Treating Leydy Worthey Primary Care Provider: PATIENT, NO Provider/Extender: G Referring Provider: Midge Minium Weeks in Treatment: 0 Information Obtained from: Patient Chief Complaint 12/08/16; this is a patient here for review of wounds on her plantar left foot Electronic Signature(s) Signed: 12/08/2016 5:51:56 PM By: Baltazar Najjar MD Entered By: Baltazar Najjar on 12/08/2016 09:32:13 Victoria Everett ([id number]) -------------------------------------------------------------------------------- HPI Details Patient Name: Victoria Everett Date of Service: 12/08/2016 8:00 AM Medical Record Patient Account Number: [id number] Number: Treating RN: Huel Coventry [birth year] (31 y.o. Other Clinician: Date of Birth/Sex: Female) Treating Azaiah Mello Primary Care Provider: PATIENT, NO Provider/Extender: G Referring Provider: Midge Minium Weeks in Treatment: 0 History of Present Illness HPI Description: 12/08/16; 31 year old woman who stepped on attack about a month ago over the plantar left fifth metatarsal head. She noted increasing pain and swelling in the left foot and was admitted to hospital from 10/3 through 10/7 with cellulitis in the left foot. She was also diagnosed with presumed type 2 diabetes with a hemoglobin A1c of 13.3 she was discharged on insulin and metformin although she is just taking insulin now. She has a positive family history of type 2 diabetes in her mother and grandmother. In any case the wound has healed over. She received Vanco and Zosyn and then was thereafter discharged on Keflex and Doxy for the cellulitis in her foot. She was  noted to have edema in the hospital and also possible peripheral neuropathy. Urinalysis apparently showed proteinuria. An MRI of the foot did not show a drainable abscess or osteomyelitis. Prominent swelling on the dorsum of the foot suggested cellulitis. This was done on 11/25/16 The patient is a smoker. ABIs in this clinic were 1.18 bilaterally Electronic Signature(s) Signed: 12/08/2016 5:51:56 PM By: Baltazar Najjar MD Entered By: Baltazar Najjar on 12/08/2016 10:13:43 Victoria Everett ([id number]) -------------------------------------------------------------------------------- Callus Pairing Details Patient Name: Victoria Everett Date of Service: 12/08/2016 8:00 AM Medical Record Patient Account Number: [id number] Number: Treating RN: Huel Coventry [birth year] (31 y.o. Other Clinician: Date of Birth/Sex: Female) Treating Jeryl Umholtz Primary Care Provider: PATIENT, NO Provider/Extender: G Referring Provider: Midge Minium Weeks in Treatment: 0 Procedure Performed for: Wound #1 Left,Plantar Metatarsal head fifth Performed By: Physician Maxwell Caul, MD Post Procedure Diagnosis Same as Pre-procedure Notes #5 curette used to remove callus. Electronic Signature(s) Signed: 12/08/2016 5:51:56 PM By: Baltazar Najjar MD Entered By: Baltazar Najjar on 12/08/2016 09:31:19 Victoria Everett ([id number]) -------------------------------------------------------------------------------- Physical Exam Details Patient Name: Victoria Everett Date of Service: 12/08/2016 8:00 AM Medical Record Patient Account Number: [id number] Number: Treating RN: Huel Coventry 02-May-1985 (31 y.o. Other Clinician: Date of Birth/Sex: Female) Treating Jhanvi Drakeford Primary Care Provider: PATIENT, NO Provider/Extender: G Referring Provider: Midge Minium Weeks in Treatment: 0 Constitutional Patient is hypertensive.. Pulse regular and within target range for patient.Marland Kitchen Respirations  regular, non-labored and within target range.. Temperature is normal and within the target range for the patient.Marland Kitchen appears in no distress. Eyes Conjunctivae clear. No discharge. Respiratory Respiratory effort is easy and symmetric bilaterally. Rate is normal at rest and on room air.. Bilateral breath sounds are clear and equal in all lobes with no wheezes, rales or rhonchi.. Cardiovascular Heart rhythm and rate regular, without murmur or gallop.. Pedal pulses palpable and strong bilaterally.. Edema present in  both extremities. This is pitting in the foot and ankle area. Lymphatic Nonpalpable no popliteal or inguinal area. Integumentary (Hair, Skin) No rash. Neurological Absent vibration and light touch in the plantar feet. Psychiatric No evidence of depression, anxiety, or agitation. Calm, cooperative, and communicative. Appropriate interactions and affect.. Notes Wound exam; the areas on the plantar left fifth metatarsal head. This had callus over the surface which I removed to make certain that there was no underlying wound here. And there was not post debridement there was nothing but healthy epithelium. No surrounding tenderness no erythema and no drainage Electronic Signature(s) Signed: 12/08/2016 5:51:56 PM By: Baltazar Najjar MD Entered By: Baltazar Najjar on 12/08/2016 09:35:46 Victoria Everett ([id number]) -------------------------------------------------------------------------------- Physician Orders Details Patient Name: Victoria Everett Date of Service: 12/08/2016 8:00 AM Medical Record Patient Account Number: [id number] Number: Treating RN: Huel Coventry 07-Jan-1986 (31 y.o. Other Clinician: Date of Birth/Sex: Female) Treating Adina Puzzo Primary Care Provider: PATIENT, NO Provider/Extender: G Referring Provider: Archer Asa in Treatment: 0 Verbal / Phone Orders: No Diagnosis Coding ICD-10 Coding Code Description E11.621 Type 2 diabetes  mellitus with foot ulcer L97.528 Non-pressure chronic ulcer of other part of left foot with other specified severity Discharge From [facility] Wound #1 Left,Plantar Metatarsal head fifth o Discharge from Wound Care Center - Consult Electronic Signature(s) Signed: 12/08/2016 5:39:47 PM By: Elliot Gurney, BSN, RN, CWS, Kim RN, BSN Signed: 12/08/2016 5:51:56 PM By: Baltazar Najjar MD Entered By: Elliot Gurney, BSN, RN, CWS, Kim on 12/08/2016 09:04:44 Victoria Everett ([id number]) -------------------------------------------------------------------------------- Problem List Details Patient Name: Victoria Everett Date of Service: 12/08/2016 8:00 AM Medical Record Number: [id number] Patient Account Number: [id number] Date of Birth/Sex: 12/05/1985 (31 y.o. Female) Treating RN: Huel Coventry Primary Care Provider: PATIENT, NO Other Clinician: Referring Provider: Midge Minium Treating Provider/Extender: Rudene Re in Treatment: 0 Active Problems ICD-10 Encounter Code Description Active Date Diagnosis E11.621 Type 2 diabetes mellitus with foot ulcer 12/08/2016 Yes L97.528 Non-pressure chronic ulcer of other part of left foot with 12/08/2016 Yes other specified severity Inactive Problems Resolved Problems Electronic Signature(s) Signed: 12/08/2016 5:51:56 PM By: Baltazar Najjar MD Signed: 12/09/2016 8:05:15 AM By: Evlyn Kanner MD, FACS Entered By: Baltazar Najjar on 12/08/2016 09:30:57 Victoria Everett ([id number]) -------------------------------------------------------------------------------- Progress Note Details Patient Name: Victoria Everett Date of Service: 12/08/2016 8:00 AM Medical Record Patient Account Number: [id number] Number: Treating RN: Huel Coventry 11-14-1985 (31 y.o. Other Clinician: Date of Birth/Sex: Female) Treating Jushua Waltman Primary Care Provider: PATIENT, NO Provider/Extender: G Referring Provider: Midge Minium Weeks in Treatment:  0 Subjective Chief Complaint Information obtained from Patient 12/08/16; this is a patient here for review of wounds on her plantar left foot History of Present Illness (HPI) 12/08/16; 31 year old woman who stepped on attack about a month ago over the plantar left fifth metatarsal head. She noted increasing pain and swelling in the left foot and was admitted to hospital from 10/3 through 10/7 with cellulitis in the left foot. She was also diagnosed with presumed type 2 diabetes with a hemoglobin A1c of 13.3 she was discharged on insulin and metformin although she is just taking insulin now. She has a positive family history of type 2 diabetes in her mother and grandmother. In any case the wound has healed over. She received Vanco and Zosyn and then was thereafter discharged on Keflex and Doxy for the cellulitis in her foot. She was noted to have edema in the hospital and also possible peripheral neuropathy. Urinalysis apparently showed proteinuria. An MRI of the foot did  not show a drainable abscess or osteomyelitis. Prominent swelling on the dorsum of the foot suggested cellulitis. This was done on 11/25/16 The patient is a smoker. ABIs in this clinic were 1.18 bilaterally Wound History Patient reportedly has not tested positive for osteomyelitis. Patient reportedly has not had testing performed to evaluate circulation in the legs. Patient experiences the following problems associated with their wounds: infection. Patient History Information obtained from Patient. Allergies No Known Drug Allergies Family History Diabetes - Mother,Maternal Grandparents, No family history of Cancer, Heart Disease, Hypertension, Kidney Disease, Lung Disease, Stroke, Thyroid Problems, Tuberculosis. Victoria Everett, Victoria Everett ([id number]) Social History Current every day smoker, Marital Status - Divorced, Alcohol Use - Rarely, Drug Use - Current History, Caffeine Use - Rarely. Medical History Eyes Denies history of  Cataracts, Glaucoma, Optic Neuritis Ear/Nose/Mouth/Throat Denies history of Chronic sinus problems/congestion, Middle ear problems Hematologic/Lymphatic Denies history of Anemia, Hemophilia, Human Immunodeficiency Virus, Lymphedema, Sickle Cell Disease Respiratory Denies history of Aspiration, Asthma, Chronic Obstructive Pulmonary Disease (COPD), Pneumothorax, Sleep Apnea, Tuberculosis Cardiovascular Patient has history of Hypertension Denies history of Angina, Arrhythmia, Congestive Heart Failure, Coronary Artery Disease, Deep Vein Thrombosis, Hypotension, Myocardial Infarction, Peripheral Arterial Disease, Peripheral Venous Disease, Phlebitis, Vasculitis Gastrointestinal Denies history of Cirrhosis , Colitis, Crohn s, Hepatitis A, Hepatitis B, Hepatitis C Endocrine Patient has history of Type II Diabetes Denies history of Type I Diabetes Genitourinary Denies history of End Stage Renal Disease Immunological Denies history of Lupus Erythematosus, Raynaud s, Scleroderma Integumentary (Skin) Denies history of History of Burn, History of pressure wounds Musculoskeletal Denies history of Gout, Rheumatoid Arthritis, Osteoarthritis Neurologic Patient has history of Neuropathy Denies history of Dementia, Quadriplegia, Paraplegia, Seizure Disorder Oncologic Denies history of Received Chemotherapy, Received Radiation Psychiatric Denies history of Anorexia/bulimia Patient is treated with Insulin. Blood sugar is tested. Blood sugar results noted at the following times: Bedtime - 170. Hospitalization/Surgery History - 11/22/2016, [facility], [facility]; foot infection. Medical And Surgical History Notes Constitutional Symptoms (General Health) HTN recent diagnosis of Diabetes Type II (A1c 13.3) Review of Systems (ROS) Crevier, Shalane ([id number]) Constitutional Symptoms (General Health) The patient has no complaints or symptoms. Eyes The patient has no complaints or  symptoms. Ear/Nose/Mouth/Throat The patient has no complaints or symptoms. Hematologic/Lymphatic The patient has no complaints or symptoms. Respiratory The patient has no complaints or symptoms. Cardiovascular Complains or has symptoms of LE edema. Denies complaints or symptoms of Chest pain. Gastrointestinal The patient has no complaints or symptoms. Endocrine The patient has no complaints or symptoms. Genitourinary The patient has no complaints or symptoms. Immunological The patient has no complaints or symptoms. Integumentary (Skin) Complains or has symptoms of Wounds. Denies complaints or symptoms of Bleeding or bruising tendency, Breakdown, Swelling. Musculoskeletal The patient has no complaints or symptoms. Neurologic The patient has no complaints or symptoms. Oncologic The patient has no complaints or symptoms. Psychiatric The patient has no complaints or symptoms. Objective Constitutional Patient is hypertensive.. Pulse regular and within target range for patient.Marland Kitchen Respirations regular, non-labored and within target range.. Temperature is normal and within the target range for the patient.Marland Kitchen appears in no distress. Vitals Time Taken: 8:14 AM, Height: 71 in, Source: Measured, Weight: 243 lbs, Source: Measured, BMI: 33.9, Temperature: 98.3 F, Pulse: 111 bpm, Respiratory Rate: 16 breaths/min, Blood Pressure: 170/95 Victoria Everett, Victoria Everett ([id number]) mmHg. Eyes Conjunctivae clear. No discharge. Respiratory Respiratory effort is easy and symmetric bilaterally. Rate is normal at rest and on room air.. Bilateral breath sounds are clear and equal in all lobes with no wheezes, rales or  rhonchi.. Cardiovascular Heart rhythm and rate regular, without murmur or gallop.. Pedal pulses palpable and strong bilaterally.. Edema present in both extremities. This is pitting in the foot and ankle area. Lymphatic Nonpalpable no popliteal or inguinal area. Neurological Absent vibration  and light touch in the plantar feet. Psychiatric No evidence of depression, anxiety, or agitation. Calm, cooperative, and communicative. Appropriate interactions and affect.. General Notes: Wound exam; the areas on the plantar left fifth metatarsal head. This had callus over the surface which I removed to make certain that there was no underlying wound here. And there was not post debridement there was nothing but healthy epithelium. No surrounding tenderness no erythema and no drainage Integumentary (Hair, Skin) No rash. Wound #1 status is Healed - Epithelialized. Original cause of wound was Trauma. The wound is located on the Left,Plantar Metatarsal head fifth. The wound measures 0cm length x 0cm width x 0cm depth; 0cm^2 area and 0cm^3 volume. There is a none present amount of drainage noted. The wound margin is indistinct and nonvisible. The periwound skin appearance exhibited: Callus, Ecchymosis. The periwound skin appearance did not exhibit: Crepitus, Excoriation, Induration, Rash, Scarring, Dry/Scaly, Maceration, Atrophie Blanche, Cyanosis, Hemosiderin Staining, Mottled, Pallor, Rubor, Erythema. 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 An Callus Pairing procedure was performed by Maxwell Caul, MD. Post procedure Diagnosis Wound #1: Same as Pre-Procedure Notes: #5 curette used to remove callus. Plan Discharge From [facility] Services: Wound #1 Left,Plantar Metatarsal head fifth: Discharge from Wound Care Center - Consult #1 the patient does not have an open wound and does not need to be followed at the wound clinic. Post- debridement of callus there was no open area here. #2 no evidence of  cellulitis in the left foot currently #3 the patient is a newly diagnosed type II diabetic but in reality I can't see that she had a C-peptide level or anti-beta cell antibodies. This probably should be done to ensure she has not a type I diabetic. When she sees her primary doctor which will happen on Friday she could possibly have reduction in her insulin and she already has had a morning hypoglycemic episode. #4 she has diabetic neuropathy clearly and possibly nephropathy as well she has Road new area. This will also be followed by her primary doctor #5 she does not need to be followed here at this time Electronic Signature(s) Signed: 12/08/2016 10:14:16 AM By: Baltazar Najjar MD Entered By: Baltazar Najjar on 12/08/2016 10:14:16 Victoria Everett ([id number]) Victoria Everett, Victoria Everett ([id number]) -------------------------------------------------------------------------------- ROS/PFSH Details Patient Name: Victoria Everett Date of Service: 12/08/2016 8:00 AM Medical Record Patient Account Number: [id number] Number: Treating RN: Huel Coventry [birth year] (31 y.o. Other Clinician: Date of Birth/Sex: Female) Treating Nioka Thorington Primary Care Provider: PATIENT, NO Provider/Extender: G Referring Provider: Midge Minium Weeks in Treatment: 0 Information Obtained From Patient Wound History Do you currently have one or more open woundso No Have you tested positive for osteomyelitis (bone infection)o No Have you had any tests for circulation on your legso No Have you had other problems associated with your woundso Infection Constitutional Symptoms (General Health) Complaints and Symptoms: No Complaints or Symptoms Complaints and Symptoms: Negative for: Fatigue; Fever; Chills; Marked Weight Change Medical History: Past Medical History Notes: HTN recent diagnosis  of Diabetes Type II (A1c 13.3) Cardiovascular Complaints and Symptoms: Positive for: LE edema Negative for: Chest  pain Medical History: Positive for: Hypertension Negative for: Angina; Arrhythmia; Congestive Heart Failure; Coronary Artery Disease; Deep Vein Thrombosis; Hypotension; Myocardial Infarction; Peripheral Arterial Disease; Peripheral Venous Disease; Phlebitis; Vasculitis Integumentary (Skin) Complaints and Symptoms: Positive for: Wounds Negative for: Bleeding or bruising tendency; Breakdown; Swelling Medical History: Negative for: History of Burn; History of pressure wounds Victoria Everett, Victoria Everett ([id number]) Eyes Complaints and Symptoms: No Complaints or Symptoms Medical History: Negative for: Cataracts; Glaucoma; Optic Neuritis Ear/Nose/Mouth/Throat Complaints and Symptoms: No Complaints or Symptoms Medical History: Negative for: Chronic sinus problems/congestion; Middle ear problems Hematologic/Lymphatic Complaints and Symptoms: No Complaints or Symptoms Medical History: Negative for: Anemia; Hemophilia; Human Immunodeficiency Virus; Lymphedema; Sickle Cell Disease Respiratory Complaints and Symptoms: No Complaints or Symptoms Medical History: Negative for: Aspiration; Asthma; Chronic Obstructive Pulmonary Disease (COPD); Pneumothorax; Sleep Apnea; Tuberculosis Gastrointestinal Complaints and Symptoms: No Complaints or Symptoms Medical History: Negative for: Cirrhosis ; Colitis; Crohnos; Hepatitis A; Hepatitis B; Hepatitis C Endocrine Complaints and Symptoms: No Complaints or Symptoms Medical History: Positive for: Type II Diabetes Negative for: Type I Diabetes Time with diabetes: 2 weeks Treated with: Insulin Victoria Everett, Victoria Everett ([id number]) Blood sugar tested every day: Yes Tested : 3 times daily Blood sugar testing results: Bedtime: 170 Genitourinary Complaints and Symptoms: No Complaints or Symptoms Medical History: Negative for: End Stage Renal Disease Immunological Complaints and Symptoms: No Complaints or Symptoms Medical History: Negative for: Lupus  Erythematosus; Raynaudos; Scleroderma Musculoskeletal Complaints and Symptoms: No Complaints or Symptoms Medical History: Negative for: Gout; Rheumatoid Arthritis; Osteoarthritis Neurologic Complaints and Symptoms: No Complaints or Symptoms Medical History: Positive for: Neuropathy Negative for: Dementia; Quadriplegia; Paraplegia; Seizure Disorder Oncologic Complaints and Symptoms: No Complaints or Symptoms Medical History: Negative for: Received Chemotherapy; Received Radiation Psychiatric Complaints and Symptoms: No Complaints or Symptoms Medical History: Victoria Everett, Victoria Everett ([id number]) Negative for: Anorexia/bulimia Immunizations Pneumococcal Vaccine: Received Pneumococcal Vaccination: No Implantable Devices Hospitalization / Surgery History Name of Hospital Purpose of Hospitalization/Surgery Date [facility]; foot infection 11/22/2016 Family and Social History Cancer: No; Diabetes: Yes - Mother,Maternal Grandparents; Heart Disease: No; Hypertension: No; Kidney Disease: No; Lung Disease: No; Stroke: No; Thyroid Problems: No; Tuberculosis: No; Current every day smoker; Marital Status - Divorced; Alcohol Use: Rarely; Drug Use: Current History; Caffeine Use: Rarely; Advanced Directives: No; Patient does not want information on Advanced Directives; Do not resuscitate: No; Living Will: No; Medical Power of Attorney: No Electronic Signature(s) Signed: 12/08/2016 5:39:47 PM By: Elliot GurneyWoody, BSN, RN, CWS, Kim RN, BSN Signed: 12/08/2016 5:51:56 PM By: Baltazar Najjarobson, Braydyn Schultes MD Entered By: Elliot GurneyWoody, BSN, RN, CWS, Kim on 12/08/2016 09:33:37 Victoria Everett, Victoria Everett ([id number]) -------------------------------------------------------------------------------- SuperBill Details Patient Name: Victoria Everett, Victoria Everett Date of Service: 12/08/2016 Medical Record Patient Account Number: [id number] Number: Treating RN: Huel CoventryWoody, Kim 1985-12-02 (31 y.o. Other Clinician: Date of Birth/Sex: Female) Treating Kaiyana Bedore,  Caylen Kuwahara Primary Care Provider: PATIENT, NO Provider/Extender: G Referring Provider: Midge MiniumKAKRAKANDY, ARSHAD Weeks in Treatment: 0 Diagnosis Coding ICD-10 Codes Code Description E11.621 Type 2 diabetes mellitus with foot ulcer L97.528 Non-pressure chronic ulcer of other part of left foot with other specified severity Facility Procedures CPT4: Description Modifier Quantity Code [id number] 99213 - WOUND CARE VISIT-LEV 3 EST PT 1 CPT4: 3244010236100021 11055 - PARE BENIGN LES; SGL 1 ICD-10 Description Diagnosis E11.621 Type 2 diabetes mellitus with foot ulcer L97.528 Non-pressure chronic ulcer of other part of left foot with other specified severity Physician Procedures CPT4: Description Modifier Quantity Code [id number] WC PHYS LEVEL 3 o  NEW PT 25 1 ICD-10 Description Diagnosis E11.621 Type 2 diabetes mellitus with foot ulcer L97.528 Non-pressure chronic ulcer of other part of left foot with other specified severity CPT4: 1610960 11055 - WC PHYS PARE BENIGN LES; SGL 1 ICD-10 Description Diagnosis E11.621 Type 2 diabetes mellitus with foot ulcer L97.528 Non-pressure chronic ulcer of other part of left foot with other specified severity Victoria Everett, Victoria Everett ([id number]) Electronic Signature(s) Signed: 12/08/2016 5:51:56 PM By: Baltazar Najjar MD Entered By: Baltazar Najjar on 12/08/2016 09:38:28

## 2016-12-10 NOTE — Progress Notes (Signed)
Victoria Everett  UXN:[id number]  URK:[id number]  DOB - September 25, 1985  Chief Complaint  Patient presents with  . Hospitalization Follow-up       Subjective:   Victoria Everett is a 31 y.o. female here today for establishment of care. She hadan past medical history when she moved here possible one month ago. She does admit that back in January she was told she had borderline elevated blood sugars but did nothing about them. She is new to the area and stepped on a tack in late September 2018. A few days later she started noting swelling and pain and hardening of the area. She went to the local emergency department and was diagnosed with cellulitis. She was also noted to have elevated blood sugar and was started on metformin 500 mg twice daily. She was started on oral antibiotics. She was given a referral for wound care but wasn't able to see them. A few days later she had worsening symptoms and went back to the emergency department. This time she was admitted for antibiotics. She was noted again to have elevated blood sugars. Her hemoglobin A1c was 13.3%. The metformin was discontinued. She was started on 70 3030 units twice daily. Her antibiotics were discontinued when she followed up with wound care 2 days ago.  From a diabetes standpoint her blood sugars have been anywhere from 66-179. She admits that when he was 68 that she was taking 35 units of insulin when she was supposed be taken 30. She has not had any more hypoglycemia since making the adjustment. No chest pain. No shortness of breath. No blurred vision. No difficulty urinating. The left lower extremity wound has healed. She was compliant with her wound care appointment. She has been told that she can stop all antibiotics.  Today she complains of swelling in bilateral lower extremities. No pain. Difficulty wearing the shoe due to the swelling. No new injuries.  She is a smoker.  ROS: GEN: denies fever or chills, denies change in  weight Skin: denies lesions or rashes HEENT: denies headache, earache, epistaxis, sore throat, or neck pain LUNGS: denies SHOB, dyspnea, PND, orthopnea CV: denies CP or palpitations ABD: denies abd pain, N or V EXT: denies muscle spasms + swelling; no pain in lower ext, no weakness NEURO: denies numbness or tingling, denies sz, stroke or TIA   ALLERGIES: No Known Allergies  PAST MEDICAL HISTORY: Elevated blood pressure Elevated blood sugar  PAST SURGICAL HISTORY: Past Surgical History:  Procedure Laterality Date  . TONSILLECTOMY      MEDICATIONS AT HOME: Prior to Admission medications   Medication Sig Start Date End Date Taking? Authorizing Provider  blood glucose meter kit and supplies KIT Dispense based on patient and insurance preference. Use up to four times daily as directed. (FOR ICD-9 250.00, 250.01). 11/28/16  Yes Bonnielee Haff, MD  insulin aspart protamine- aspart (NOVOLOG MIX 70/30) (70-30) 100 UNIT/ML injection Inject 0.3 mLs (30 Units total) into the skin 2 (two) times daily with a meal. 12/10/16  Yes Ena Dawley, Genasis Zingale S, PA-C  Insulin Syringe-Needle U-100 30G 0.5 ML MISC Use as directed 11/28/16  Yes Bonnielee Haff, MD  amLODipine (NORVASC) 5 MG tablet Take 1 tablet (5 mg total) by mouth daily. Patient not taking: Reported on 12/10/2016 11/28/16   Bonnielee Haff, MD  cephALEXin (KEFLEX) 500 MG capsule Take 1 capsule (500 mg total) by mouth 4 (four) times daily. Patient not taking: Reported on 12/10/2016 11/21/16   Ashley Murrain, NP  HYDROcodone-acetaminophen (NORCO/VICODIN)  5-325 MG tablet Take 1 tablet by mouth every 4 (four) hours as needed for severe pain. Patient not taking: Reported on 12/10/2016 11/28/16   Bonnielee Haff, MD  lisinopril-hydrochlorothiazide (PRINZIDE,ZESTORETIC) 10-12.5 MG tablet Take 1 tablet by mouth daily. 12/10/16   Brayton Caves, PA-C  metFORMIN (GLUCOPHAGE) 500 MG tablet Take 1 tablet (500 mg total) by mouth 2 (two) times daily with a  meal. Patient not taking: Reported on 12/10/2016 11/21/16   Ashley Murrain, NP    Family History  Problem Relation Age of Onset  . Diabetes Mellitus II Mother    Social-recently moved here from [facility], smokes, works through a temp agency  Objective:   Vitals:   12/10/16 1348  BP: (!) 161/94  Pulse: 80  Resp: 18  Temp: 98.2 F (36.8 C)  TempSrc: Oral  SpO2: 96%  Weight: 243 lb 6.4 oz (110.4 kg)  Height: _0  (1.803 m)    Exam General appearance : Awake, alert, not in any distress. Speech Clear. Not toxic looking HEENT: Atraumatic and Normocephalic, pupils equally reactive to light and accomodation Neck: supple, no JVD. No cervical lymphadenopathy.  Chest:Good air entry bilaterally, no added sounds  CVS: S1 S2 regular, no murmurs.  Abdomen: Bowel sounds present, Non tender and not distended with no guarding, rigidity or rebound. Extremities: B/L Lower Ext shows 2-3+ edema, both legs are warm to touch Neurology: Awake alert, and oriented X 3, CN II-XII intact, Non focal Skin:No Rash Wounds:N/A  Data Review Lab Results  Component Value Date   HGBA1C 13.3 (H) 11/25/2016    Assessment & Plan  1. DM 2, new onset  -Cont 70/30 30 U BID, off Metformin Aim for 30 minutes of exercise most days. Rethink what you drink. Water is great! Aim for 2-3 Carb Choices per meal (30-45 grams) +/- 1 either way  Aim for 0-15 Carbs per snack if hungry  Include protein in moderation with your meals and snacks  Consider reading food labels for Total Carbohydrate and Fat Grams of foods  Consider checking BG at alternate times per day  Continue taking medication as directed Be mindful about how much sugar you are adding to beverages and other foods. Fruit Punch - find one with no sugar  Measure and decrease portions of carbohydrate foods  Make your plate and don't go back for seconds  -Add ACE  -keep a log and bring to visits 2. HTN  -Add ACE/HCTZ   -check BMP next visit  -low  salt diet  -inc aerobic activity 3. Proteinuria  -Add ACE  -will follow 4. Smoker  -cessation discussed, working on reduction 5. LLE cellulitis-resolved 6. AKI  -BMP next visit   Return in about 2 years (around 12/11/2018).  The patient was given clear instructions to go to ER or return to medical center if symptoms don't improve, worsen or new problems develop. The patient verbalized understanding. The patient was told to call to get lab results if they haven't heard anything in the next week.   Total time spent with patient was 48 min . Greater than 50 % of this visit was spent face to face counseling and coordinating care regarding risk factor modification, compliance importance and encouragement, education related to diabetes and high blood pressure risks.  This note has been created with Surveyor, quantity. Any transcriptional errors are unintentional.    Zettie Pho, PA-C [facility], [place]   12/10/2016,  2:17 PM

## 2016-12-13 NOTE — Progress Notes (Addendum)
DEVINN, VOSHELL ([id number]) Visit Report for 12/08/2016 Allergy List Details Patient Name: CATY, TESSLER Date of Service: 12/08/2016 8:00 AM Medical Record Number: [id number] Patient Account Number: [id number] Date of Birth/Sex: [birth year] (31 y.o. Female) Treating RN: Huel Coventry Primary Care Terriana Barreras: PATIENT, NO Other Clinician: Referring Vali Capano: Midge Minium Treating Xenia Nile/Extender: Maxwell Caul Weeks in Treatment: 0 Allergies Active Allergies No Known Drug Allergies Allergy Notes Electronic Signature(s) Signed: 12/08/2016 5:39:47 PM By: Elliot Gurney, BSN, RN, CWS, Kim RN, BSN Entered By: Elliot Gurney, BSN, RN, CWS, Kim on 12/08/2016 08:41:32 Gershon Cull ([id number]) -------------------------------------------------------------------------------- Arrival Information Details Patient Name: Gershon Cull Date of Service: 12/08/2016 8:00 AM Medical Record Number: [id number] Patient Account Number: [id number] Date of Birth/Sex: 04-Dec-1985 (31 y.o. Female) Treating RN: Huel Coventry Primary Care Zamara Cozad: PATIENT, NO Other Clinician: Referring Railee Bonillas: Midge Minium Treating Marquis Diles/Extender: Altamese [place] in Treatment: 0 Visit Information Patient Arrived: Ambulatory Arrival Time: 08:13 Accompanied By: self Transfer Assistance: None Patient Identification Verified: Yes Secondary Verification Process Completed: Yes Patient Requires Transmission-Based No Precautions: Patient Has Alerts: Yes Patient Alerts: Type II Diabetic Electronic Signature(s) Signed: 12/08/2016 5:39:47 PM By: Elliot Gurney, BSN, RN, CWS, Kim RN, BSN Entered By: Elliot Gurney, BSN, RN, CWS, Kim on 12/08/2016 08:41:03 Gershon Cull ([id number]) -------------------------------------------------------------------------------- Clinic Level of Care Assessment Details Patient Name: Gershon Cull Date of Service: 12/08/2016 8:00 AM Medical Record Number: [id number] Patient Account Number:  [id number] Date of Birth/Sex: Mar 08, 1985 (31 y.o. Female) Treating RN: Huel Coventry Primary Care Dilyn Smiles: PATIENT, NO Other Clinician: Referring Caldwell Kronenberger: Midge Minium Treating Launi Asencio/Extender: Altamese [place] in Treatment: 0 Clinic Level of Care Assessment Items TOOL 1 Quantity Score []  - Use when EandM and Procedure is performed on INITIAL visit 0 ASSESSMENTS - Nursing Assessment / Reassessment X - General Physical Exam (combine w/ comprehensive assessment (listed just below) when 1 20 performed on new pt. evals) X- 1 25 Comprehensive Assessment (HX, ROS, Risk Assessments, Wounds Hx, etc.) ASSESSMENTS - Wound and Skin Assessment / Reassessment []  - Dermatologic / Skin Assessment (not related to wound area) 0 ASSESSMENTS - Ostomy and/or Continence Assessment and Care []  - Incontinence Assessment and Management 0 []  - 0 Ostomy Care Assessment and Management (repouching, etc.) PROCESS - Coordination of Care X - Simple Patient / Family Education for ongoing care 1 15 []  - 0 Complex (extensive) Patient / Family Education for ongoing care X- 1 10 Staff obtains Chiropractor, Records, Test Results / Process Orders []  - 0 Staff telephones HHA, Nursing Homes / Clarify orders / etc []  - 0 Routine Transfer to another Facility (non-emergent condition) []  - 0 Routine Hospital Admission (non-emergent condition) X- 1 15 New Admissions / Manufacturing engineer / Ordering NPWT, Apligraf, etc. []  - 0 Emergency Hospital Admission (emergent condition) PROCESS - Special Needs []  - Pediatric / Minor Patient Management 0 []  - 0 Isolation Patient Management []  - 0 Hearing / Language / Visual special needs []  - 0 Assessment of Community assistance (transportation, D/C planning, etc.) []  - 0 Additional assistance / Altered mentation []  - 0 Support Surface(s) Assessment (bed, cushion, seat, etc.) CLYTIE, SHETLEY ([id number]) INTERVENTIONS - Miscellaneous []  - External ear exam  0 []  - 0 Patient Transfer (multiple staff / Nurse, adult / Similar devices) []  - 0 Simple Staple / Suture removal (25 or less) []  - 0 Complex Staple / Suture removal (26 or more) []  - 0 Hypo/Hyperglycemic Management (do not check if billed separately) X- 1 15 Ankle / Brachial Index (ABI) - do not check if billed separately Has  the patient been seen at the hospital within the last three years: Yes Total Score: 100 Level Of Care: New/Established - Level 3 Electronic Signature(s) Signed: 12/08/2016 5:39:47 PM By: Elliot Gurney, BSN, RN, CWS, Kim RN, BSN Entered By: Elliot Gurney, BSN, RN, CWS, Kim on 12/08/2016 09:05:04 Gershon Cull ([id number]) -------------------------------------------------------------------------------- Encounter Discharge Information Details Patient Name: Gershon Cull Date of Service: 12/08/2016 8:00 AM Medical Record Number: [id number] Patient Account Number: [id number] Date of Birth/Sex: 1985-05-28 (31 y.o. Female) Treating RN: Huel Coventry Primary Care Rakia Frayne: PATIENT, NO Other Clinician: Referring Amilee Janvier: Midge Minium Treating Delfin Squillace/Extender: Altamese [place] in Treatment: 0 Encounter Discharge Information Items Discharge Pain Level: 0 Discharge Condition: Stable Ambulatory Status: Ambulatory Discharge Destination: Home Transportation: Private Auto Accompanied By: self Schedule Follow-up Appointment: Yes Medication Reconciliation completed and Yes provided to Patient/Care Monigue Spraggins: Provided on Clinical Summary of Care: 12/08/2016 Form Type Recipient Paper Patient CT Electronic Signature(s) Signed: 12/09/2016 3:59:52 PM By: Gwenlyn Perking Entered By: Gwenlyn Perking on 12/08/2016 09:10:44 Gershon Cull ([id number]) -------------------------------------------------------------------------------- Lower Extremity Assessment Details Patient Name: Gershon Cull Date of Service: 12/08/2016 8:00 AM Medical Record Number: [id number] Patient  Account Number: [id number] Date of Birth/Sex: [birth year] (31 y.o. Female) Treating RN: Huel Coventry Primary Care Tao Satz: PATIENT, NO Other Clinician: Referring Kenai Fluegel: Midge Minium Treating Shundra Wirsing/Extender: Altamese [place] in Treatment: 0 Vascular Assessment Pulses: Dorsalis Pedis Palpable: [Left:Yes] [Right:Yes] Doppler Audible: [Left:Yes] [Right:Yes] Posterior Tibial Palpable: [Left:Yes] [Right:Yes] Doppler Audible: [Left:Yes] [Right:Yes] Extremity colors, hair growth, and conditions: Extremity Color: [Left:Normal] [Right:Normal] Hair Growth on Extremity: [Left:Yes] [Right:Yes] Temperature of Extremity: [Left:Warm] [Right:Warm] Capillary Refill: [Left:< 3 seconds] [Right:< 3 seconds] Dependent Rubor: [Left:No] [Right:No] Blanched when Elevated: [Left:No] [Right:No] Lipodermatosclerosis: [Left:No] [Right:No] Blood Pressure: Brachial: [Left:170] [Right:170] Dorsalis Pedis: 200 [Left:Dorsalis Pedis: 160] Ankle: Posterior Tibial: 160 [Left:Posterior Tibial: 200 1.18] [Right:1.18] Toe Nail Assessment Left: Right: Thick: No No Discolored: No No Deformed: No No Improper Length and Hygiene: No No Electronic Signature(s) Signed: 12/08/2016 5:39:47 PM By: Elliot Gurney, BSN, RN, CWS, Kim RN, BSN Entered By: Elliot Gurney, BSN, RN, CWS, Kim on 12/08/2016 08:41:24 Gershon Cull ([id number]) -------------------------------------------------------------------------------- Multi Wound Chart Details Patient Name: Gershon Cull Date of Service: 12/08/2016 8:00 AM Medical Record Number: [id number] Patient Account Number: [id number] Date of Birth/Sex: 08/08/1985 (31 y.o. Female) Treating RN: Huel Coventry Primary Care Leroy Pettway: PATIENT, NO Other Clinician: Referring Numair Masden: Midge Minium Treating Rooney Swails/Extender: Altamese  in Treatment: 0 Vital Signs Height(in): 71 Pulse(bpm): 111 Weight(lbs): 243 Blood Pressure(mmHg): 170/95 Body Mass Index(BMI):  34 Temperature(F): 98.3 Respiratory Rate 16 (breaths/min): Photos: [N/A:N/A] Wound Location: Left, Plantar Metatarsal head N/A N/A fifth Wounding Event: Trauma N/A N/A Primary Etiology: Trauma, Other N/A N/A Date Acquired: 11/22/2016 N/A N/A Weeks of Treatment: 0 N/A N/A Wound Status: Healed - Epithelialized N/A N/A Measurements L x W x D 0x0x0 N/A N/A (cm) Area (cm) : 0 N/A N/A Volume (cm) : 0 N/A N/A % Reduction in Area: 100.00% N/A N/A % Reduction in Volume: 100.00% N/A N/A Classification: Unclassifiable N/A N/A Exudate Amount: None Present N/A N/A Wound Margin: Indistinct, nonvisible N/A N/A Exposed Structures: Fascia: No N/A N/A Fat Layer (Subcutaneous Tissue) Exposed: No Tendon: No Muscle: No Joint: No Bone: No Epithelialization: Large (67-100%) N/A N/A Periwound Skin Texture: Callus: Yes N/A N/A Excoriation: No Induration: No Crepitus: No Rash: No Scarring: No Periwound Skin Moisture: N/A N/A SHAMIEKA, GULLO ([id number]) Maceration: No Dry/Scaly: No Periwound Skin Color: Ecchymosis: Yes N/A N/A Atrophie Blanche: No Cyanosis: No Erythema: No Hemosiderin Staining: No Mottled: No Pallor: No Rubor:  No Tenderness on Palpation: No N/A N/A Wound Preparation: Ulcer Cleansing: N/A N/A Rinsed/Irrigated with Saline Procedures Performed: Callus Pairing N/A N/A Treatment Notes Wound #1 (Left, Plantar Metatarsal head fifth) 1. Cleansed with: Clean wound with Normal Saline Electronic Signature(s) Signed: 12/08/2016 5:51:56 PM By: Baltazar Najjar MD Entered By: Baltazar Najjar on 12/08/2016 09:31:07 Gershon Cull ([id number]) -------------------------------------------------------------------------------- Multi-Disciplinary Care Plan Details Patient Name: Gershon Cull Date of Service: 12/08/2016 8:00 AM Medical Record Number: [id number] Patient Account Number: [id number] Date of Birth/Sex: Aug 11, 1985 (31 y.o. Female) Treating RN: Huel Coventry Primary  Care Irie Fiorello: PATIENT, NO Other Clinician: Referring Kayren Holck: Midge Minium Treating Marlon Vonruden/Extender: Altamese [place] in Treatment: 0 Active Inactive Electronic Signature(s) Signed: 12/14/2016 6:02:00 PM By: Elliot Gurney, BSN, RN, CWS, Kim RN, BSN Previous Signature: 12/08/2016 5:39:47 PM Version By: Elliot Gurney, BSN, RN, CWS, Kim RN, BSN Entered By: Elliot Gurney, BSN, RN, CWS, Kim on 12/14/2016 18:01:59 Gershon Cull ([id number]) -------------------------------------------------------------------------------- Pain Assessment Details Patient Name: Gershon Cull Date of Service: 12/08/2016 8:00 AM Medical Record Number: [id number] Patient Account Number: [id number] Date of Birth/Sex: 01/06/1986 (31 y.o. Female) Treating RN: Huel Coventry Primary Care Sherylann Vangorden: PATIENT, NO Other Clinician: Referring Lenin Kuhnle: Midge Minium Treating Johntavius Shepard/Extender: Altamese [place] in Treatment: 0 Active Problems Location of Pain Severity and Description of Pain Patient Has Paino No Site Locations With Dressing Change: No Pain Management and Medication Current Pain Management: Goals for Pain Management Topical or injectable lidocaine is offered to patient for acute pain when surgical debridement is performed. If needed, Patient is instructed to use over the counter pain medication for the following 24-48 hours after debridement. Wound care MDs do not prescribed pain medications. Patient has chronic pain or uncontrolled pain. Patient has been instructed to make an appointment with their Primary Care Physician for pain management. Electronic Signature(s) Signed: 12/08/2016 5:39:47 PM By: Elliot Gurney, BSN, RN, CWS, Kim RN, BSN Entered By: Elliot Gurney, BSN, RN, CWS, Kim on 12/08/2016 08:41:10 Gershon Cull ([id number]) -------------------------------------------------------------------------------- Patient/Caregiver Education Details Patient Name: Gershon Cull Date of Service: 12/08/2016 8:00  AM Medical Record Number: [id number] Patient Account Number: [id number] Date of Birth/Gender: 01-26-1986 (31 y.o. Female) Treating RN: Huel Coventry Primary Care Physician: PATIENT, NO Other Clinician: Referring Physician: Midge Minium Treating Physician/Extender: Altamese [place] in Treatment: 0 Education Assessment Education Provided To: Patient Education Topics Provided Elevated Blood Sugar/ Impact on Healing: Handouts: Elevated Blood Sugars: How Do They Affect Wound Healing Methods: Demonstration, Explain/Verbal Responses: State content correctly Smoking and Wound Healing: Handouts: Smoking and Wound Healing Methods: Demonstration Responses: State content correctly [facility] To [facility]: Handouts: Welcome To [facility] Methods: Demonstration Responses: State content correctly Electronic Signature(s) Signed: 12/08/2016 5:39:47 PM By: Elliot Gurney, BSN, RN, CWS, Kim RN, BSN Entered By: Elliot Gurney, BSN, RN, CWS, Kim on 12/08/2016 09:07:47 Gershon Cull ([id number]) -------------------------------------------------------------------------------- Wound Assessment Details Patient Name: Gershon Cull Date of Service: 12/08/2016 8:00 AM Medical Record Number: [id number] Patient Account Number: [id number] Date of Birth/Sex: 12/11/1985 (31 y.o. Female) Treating RN: Huel Coventry Primary Care Xena Propst: PATIENT, NO Other Clinician: Referring Aleeya Veitch: Midge Minium Treating Marlon Suleiman/Extender: Maxwell Caul Weeks in Treatment: 0 Wound Status Wound Number: 1 Primary Etiology: Trauma, Other Wound Location: Left, Plantar Metatarsal head fifth Wound Status: Healed - Epithelialized Wounding Event: Trauma Date Acquired: 11/22/2016 Weeks Of Treatment: 0 Clustered Wound: No Photos Wound Measurements Length: (cm) 0 % Width: (cm) 0 % Depth: (cm) 0 Ep Area: (cm) 0 Volume: (cm) 0 Reduction in Area: 100% Reduction in Volume: 100% ithelialization: Large  (  67-100%) Wound Description Classification: Unclassifiable Wound Margin: Indistinct, nonvisible Exudate Amount: None Present Foul Odor After Cleansing: No Slough/Fibrino No Wound Bed Exposed Structure Fascia Exposed: No Fat Layer (Subcutaneous Tissue) Exposed: No Tendon Exposed: No Muscle Exposed: No Joint Exposed: No Bone Exposed: No Periwound Skin Texture Texture Color No Abnormalities Noted: No No Abnormalities Noted: No Callus: Yes Atrophie Blanche: No Crepitus: No Cyanosis: No Excoriation: No Ecchymosis: Yes Induration: No Erythema: No Maisie FusHOMAS, Amandalee ([id number]) Rash: No Hemosiderin Staining: No Scarring: No Mottled: No Pallor: No Moisture Rubor: No No Abnormalities Noted: No Dry / Scaly: No Maceration: No Wound Preparation Ulcer Cleansing: Rinsed/Irrigated with Saline Treatment Notes Wound #1 (Left, Plantar Metatarsal head fifth) 1. Cleansed with: Clean wound with Normal Saline Electronic Signature(s) Signed: 12/08/2016 5:39:47 PM By: Elliot GurneyWoody, BSN, RN, CWS, Kim RN, BSN Entered By: Elliot GurneyWoody, BSN, RN, CWS, Kim on 12/08/2016 09:07:04 Gershon CullHOMAS, Ryleeann ([id number]) -------------------------------------------------------------------------------- Vitals Details Patient Name: Gershon CullHOMAS, Jovee Date of Service: 12/08/2016 8:00 AM Medical Record Number: [id number] Patient Account Number: [id number] Date of Birth/Sex: October 08, 1985 (31 y.o. Female) Treating RN: Huel CoventryWoody, Kim Primary Care Denae Zulueta: PATIENT, NO Other Clinician: Referring Neasia Fleeman: Midge MiniumKAKRAKANDY, ARSHAD Treating Cashmere Harmes/Extender: Altamese CarolinaOBSON, MICHAEL G Weeks in Treatment: 0 Vital Signs Time Taken: 08:14 Temperature (F): 98.3 Height (in): 71 Pulse (bpm): 111 Source: Measured Respiratory Rate (breaths/min): 16 Weight (lbs): 243 Blood Pressure (mmHg): 170/95 Source: Measured Reference Range: 80 - 120 mg / dl Body Mass Index (BMI): 33.9 Electronic Signature(s) Signed: 12/08/2016 5:39:47 PM By: Elliot GurneyWoody, BSN, RN,  CWS, Kim RN, BSN Entered By: Elliot GurneyWoody, BSN, RN, CWS, Kim on 12/08/2016 08:41:15

## 2016-12-25 ENCOUNTER — Inpatient Hospital Stay (HOSPITAL_COMMUNITY)
Admission: EM | Admit: 2016-12-25 | Discharge: 2016-12-31 | DRG: 872 | Disposition: A | Discharge status: Home / Self Care | Payer: Self-pay | Attending: Family Medicine | Admitting: Family Medicine

## 2016-12-25 ENCOUNTER — Emergency Department (HOSPITAL_COMMUNITY): Payer: Self-pay

## 2016-12-25 ENCOUNTER — Encounter (HOSPITAL_COMMUNITY): Payer: Self-pay | Admitting: Emergency Medicine

## 2016-12-25 DIAGNOSIS — H538 Other visual disturbances: Secondary | ICD-10-CM | POA: Diagnosis present

## 2016-12-25 DIAGNOSIS — L02612 Cutaneous abscess of left foot: Secondary | ICD-10-CM | POA: Diagnosis present

## 2016-12-25 DIAGNOSIS — E669 Obesity, unspecified: Secondary | ICD-10-CM | POA: Diagnosis present

## 2016-12-25 DIAGNOSIS — E11628 Type 2 diabetes mellitus with other skin complications: Secondary | ICD-10-CM | POA: Diagnosis present

## 2016-12-25 DIAGNOSIS — R52 Pain, unspecified: Secondary | ICD-10-CM

## 2016-12-25 DIAGNOSIS — N179 Acute kidney failure, unspecified: Secondary | ICD-10-CM | POA: Diagnosis present

## 2016-12-25 DIAGNOSIS — L03116 Cellulitis of left lower limb: Secondary | ICD-10-CM | POA: Diagnosis present

## 2016-12-25 DIAGNOSIS — A419 Sepsis, unspecified organism: Principal | ICD-10-CM | POA: Diagnosis present

## 2016-12-25 DIAGNOSIS — F17200 Nicotine dependence, unspecified, uncomplicated: Secondary | ICD-10-CM | POA: Diagnosis present

## 2016-12-25 DIAGNOSIS — D649 Anemia, unspecified: Secondary | ICD-10-CM | POA: Diagnosis present

## 2016-12-25 DIAGNOSIS — I10 Essential (primary) hypertension: Secondary | ICD-10-CM | POA: Diagnosis present

## 2016-12-25 DIAGNOSIS — D509 Iron deficiency anemia, unspecified: Secondary | ICD-10-CM | POA: Diagnosis present

## 2016-12-25 DIAGNOSIS — E1142 Type 2 diabetes mellitus with diabetic polyneuropathy: Secondary | ICD-10-CM | POA: Diagnosis present

## 2016-12-25 DIAGNOSIS — Z794 Long term (current) use of insulin: Secondary | ICD-10-CM

## 2016-12-25 DIAGNOSIS — Z6833 Body mass index (BMI) 33.0-33.9, adult: Secondary | ICD-10-CM

## 2016-12-25 DIAGNOSIS — Z79899 Other long term (current) drug therapy: Secondary | ICD-10-CM

## 2016-12-25 DIAGNOSIS — L97529 Non-pressure chronic ulcer of other part of left foot with unspecified severity: Secondary | ICD-10-CM | POA: Diagnosis present

## 2016-12-25 DIAGNOSIS — E11621 Type 2 diabetes mellitus with foot ulcer: Secondary | ICD-10-CM | POA: Diagnosis present

## 2016-12-25 DIAGNOSIS — E119 Type 2 diabetes mellitus without complications: Secondary | ICD-10-CM

## 2016-12-25 DIAGNOSIS — E13621 Other specified diabetes mellitus with foot ulcer: Secondary | ICD-10-CM

## 2016-12-25 DIAGNOSIS — D638 Anemia in other chronic diseases classified elsewhere: Secondary | ICD-10-CM | POA: Diagnosis present

## 2016-12-25 HISTORY — DX: Thyrotoxicosis, unspecified without thyrotoxic crisis or storm: E05.90

## 2016-12-25 HISTORY — DX: Type 2 diabetes mellitus without complications: E11.9

## 2016-12-25 LAB — CBC WITH DIFFERENTIAL/PLATELET
Basophils Absolute: 0 10*3/uL (ref 0.0–0.1)
Basophils Relative: 0 %
EOS ABS: 0.1 10*3/uL (ref 0.0–0.7)
Eosinophils Relative: 1 %
HCT: 30 % — ABNORMAL LOW (ref 36.0–46.0)
HEMOGLOBIN: 9.5 g/dL — AB (ref 12.0–15.0)
LYMPHS ABS: 1.3 10*3/uL (ref 0.7–4.0)
Lymphocytes Relative: 11 %
MCH: 25.7 pg — AB (ref 26.0–34.0)
MCHC: 31.7 g/dL (ref 30.0–36.0)
MCV: 81.1 fL (ref 78.0–100.0)
MONO ABS: 0.6 10*3/uL (ref 0.1–1.0)
MONOS PCT: 5 %
NEUTROS PCT: 83 %
Neutro Abs: 9.7 10*3/uL — ABNORMAL HIGH (ref 1.7–7.7)
PLATELETS: 275 10*3/uL (ref 150–400)
RBC: 3.7 MIL/uL — ABNORMAL LOW (ref 3.87–5.11)
RDW: 12.8 % (ref 11.5–15.5)
WBC: 11.7 10*3/uL — ABNORMAL HIGH (ref 4.0–10.5)

## 2016-12-25 LAB — COMPREHENSIVE METABOLIC PANEL
ALBUMIN: 3.1 g/dL — AB (ref 3.5–5.0)
ALK PHOS: 68 U/L (ref 38–126)
ALT: 13 U/L — AB (ref 14–54)
AST: 20 U/L (ref 15–41)
Anion gap: 9 (ref 5–15)
BUN: 18 mg/dL (ref 6–20)
CALCIUM: 8.6 mg/dL — AB (ref 8.9–10.3)
CHLORIDE: 105 mmol/L (ref 101–111)
CO2: 25 mmol/L (ref 22–32)
CREATININE: 0.87 mg/dL (ref 0.44–1.00)
GFR calc non Af Amer: >60 mL/min (ref >60)
GLUCOSE: 83 mg/dL (ref 65–99)
Potassium: 3.5 mmol/L (ref 3.5–5.1)
SODIUM: 139 mmol/L (ref 135–145)
Total Bilirubin: 0.7 mg/dL (ref 0.3–1.2)
Total Protein: 7.5 g/dL (ref 6.5–8.1)

## 2016-12-25 LAB — URINALYSIS, ROUTINE W REFLEX MICROSCOPIC
Bilirubin Urine: NEGATIVE
GLUCOSE, UA: NEGATIVE mg/dL
Ketones, ur: NEGATIVE mg/dL
Leukocytes, UA: NEGATIVE
Nitrite: NEGATIVE
PH: 7 (ref 5.0–8.0)
Protein, ur: 100 mg/dL — AB
SPECIFIC GRAVITY, URINE: 1.011 (ref 1.005–1.030)

## 2016-12-25 LAB — GLUCOSE, CAPILLARY: GLUCOSE-CAPILLARY: 96 mg/dL (ref 65–99)

## 2016-12-25 LAB — SEDIMENTATION RATE: Sed Rate: 95 mm/hr — ABNORMAL HIGH (ref 0–22)

## 2016-12-25 LAB — PREGNANCY, URINE: Preg Test, Ur: NEGATIVE

## 2016-12-25 LAB — I-STAT BETA HCG BLOOD, ED (MC, WL, AP ONLY): I-stat hCG, quantitative: 5.1 m[IU]/mL — ABNORMAL HIGH (ref <5)

## 2016-12-25 LAB — INFLUENZA PANEL BY PCR (TYPE A & B)
INFLBPCR: NEGATIVE
Influenza A By PCR: NEGATIVE

## 2016-12-25 LAB — CBG MONITORING, ED: Glucose-Capillary: 146 mg/dL — ABNORMAL HIGH (ref 65–99)

## 2016-12-25 LAB — I-STAT CG4 LACTIC ACID, ED
LACTIC ACID, VENOUS: 0.94 mmol/L (ref 0.5–1.9)
LACTIC ACID, VENOUS: 1.11 mmol/L (ref 0.5–1.9)

## 2016-12-25 MED ORDER — VANCOMYCIN HCL IN DEXTROSE 1-5 GM/200ML-% IV SOLN
1000.0000 mg | Freq: Once | INTRAVENOUS | Status: AC
  Administered 2016-12-25: 1000 mg via INTRAVENOUS
  Filled 2016-12-25: qty 200

## 2016-12-25 MED ORDER — INSULIN ASPART 100 UNIT/ML ~~LOC~~ SOLN
0.0000 [IU] | Freq: Every day | SUBCUTANEOUS | Status: DC
  Administered 2016-12-28: 2 [IU] via SUBCUTANEOUS

## 2016-12-25 MED ORDER — ACETAMINOPHEN 325 MG PO TABS
650.0000 mg | ORAL_TABLET | Freq: Once | ORAL | Status: AC
  Administered 2016-12-25: 650 mg via ORAL
  Filled 2016-12-25: qty 2

## 2016-12-25 MED ORDER — SODIUM CHLORIDE 0.9 % IV BOLUS (SEPSIS)
1000.0000 mL | Freq: Once | INTRAVENOUS | Status: AC
  Administered 2016-12-25: 1000 mL via INTRAVENOUS

## 2016-12-25 MED ORDER — ONDANSETRON HCL 4 MG/2ML IJ SOLN
4.0000 mg | Freq: Once | INTRAMUSCULAR | Status: AC
  Administered 2016-12-25: 4 mg via INTRAVENOUS
  Filled 2016-12-25: qty 2

## 2016-12-25 MED ORDER — HYDROMORPHONE HCL 1 MG/ML IJ SOLN
0.5000 mg | Freq: Once | INTRAMUSCULAR | Status: AC
  Administered 2016-12-25: 0.5 mg via INTRAVENOUS
  Filled 2016-12-25: qty 0.5

## 2016-12-25 MED ORDER — PIPERACILLIN-TAZOBACTAM 3.375 G IVPB 30 MIN
3.3750 g | Freq: Once | INTRAVENOUS | Status: AC
  Administered 2016-12-25: 3.375 g via INTRAVENOUS
  Filled 2016-12-25: qty 50

## 2016-12-25 MED ORDER — ONDANSETRON HCL 4 MG/2ML IJ SOLN
4.0000 mg | Freq: Four times a day (QID) | INTRAMUSCULAR | Status: DC | PRN
  Administered 2016-12-25: 4 mg via INTRAVENOUS
  Filled 2016-12-25: qty 2

## 2016-12-25 MED ORDER — SODIUM CHLORIDE 0.9 % IV SOLN
INTRAVENOUS | Status: DC
  Administered 2016-12-25: 23:00:00 via INTRAVENOUS

## 2016-12-25 MED ORDER — LISINOPRIL 10 MG PO TABS
10.0000 mg | ORAL_TABLET | Freq: Every day | ORAL | Status: DC
  Administered 2016-12-26 – 2016-12-29 (×4): 10 mg via ORAL
  Filled 2016-12-25 (×4): qty 1

## 2016-12-25 MED ORDER — INSULIN GLARGINE 100 UNIT/ML ~~LOC~~ SOLN
25.0000 [IU] | Freq: Every day | SUBCUTANEOUS | Status: DC
  Administered 2016-12-25 – 2016-12-31 (×6): 25 [IU] via SUBCUTANEOUS
  Filled 2016-12-25 (×7): qty 0.25

## 2016-12-25 MED ORDER — ACETAMINOPHEN 325 MG PO TABS: 650.0000 mg | ORAL_TABLET | Freq: Four times a day (QID) | ORAL | Status: DC | PRN

## 2016-12-25 MED ORDER — ACETAMINOPHEN 650 MG RE SUPP
650.0000 mg | Freq: Four times a day (QID) | RECTAL | Status: DC | PRN
Start: 2016-12-25 — End: 2016-12-27

## 2016-12-25 MED ORDER — VANCOMYCIN HCL 10 G IV SOLR
1250.0000 mg | Freq: Two times a day (BID) | INTRAVENOUS | Status: DC
  Administered 2016-12-26 – 2016-12-28 (×5): 1250 mg via INTRAVENOUS
  Filled 2016-12-25 (×7): qty 1250

## 2016-12-25 MED ORDER — PIPERACILLIN-TAZOBACTAM 3.375 G IVPB
3.3750 g | Freq: Three times a day (TID) | INTRAVENOUS | Status: DC
  Administered 2016-12-26 (×2): 3.375 g via INTRAVENOUS
  Filled 2016-12-25 (×2): qty 50

## 2016-12-25 MED ORDER — ONDANSETRON HCL 4 MG PO TABS: 4.0000 mg | ORAL_TABLET | Freq: Four times a day (QID) | ORAL | Status: DC | PRN

## 2016-12-25 MED ORDER — HYDROMORPHONE HCL 1 MG/ML IJ SOLN
0.5000 mg | Freq: Once | INTRAMUSCULAR | Status: AC
  Administered 2016-12-25: 0.5 mg via INTRAVENOUS
  Filled 2016-12-25: qty 1

## 2016-12-25 MED ORDER — SODIUM CHLORIDE 0.9 % IV BOLUS (SEPSIS)
500.0000 mL | Freq: Once | INTRAVENOUS | Status: AC
  Administered 2016-12-25: 500 mL via INTRAVENOUS

## 2016-12-25 MED ORDER — INSULIN ASPART 100 UNIT/ML ~~LOC~~ SOLN
0.0000 [IU] | Freq: Three times a day (TID) | SUBCUTANEOUS | Status: DC
  Administered 2016-12-26: 2 [IU] via SUBCUTANEOUS
  Administered 2016-12-26 – 2016-12-28 (×2): 3 [IU] via SUBCUTANEOUS
  Administered 2016-12-28: 5 [IU] via SUBCUTANEOUS
  Administered 2016-12-29 – 2016-12-30 (×5): 3 [IU] via SUBCUTANEOUS
  Administered 2016-12-30 – 2016-12-31 (×3): 2 [IU] via SUBCUTANEOUS

## 2016-12-25 MED ORDER — HYDROCODONE-ACETAMINOPHEN 5-325 MG PO TABS
1.0000 | ORAL_TABLET | ORAL | Status: DC | PRN
  Administered 2016-12-26 – 2016-12-31 (×10): 2 via ORAL
  Filled 2016-12-25 (×12): qty 2

## 2016-12-25 MED ORDER — VANCOMYCIN HCL IN DEXTROSE 1-5 GM/200ML-% IV SOLN
1000.0000 mg | INTRAVENOUS | Status: AC
  Administered 2016-12-25: 1000 mg via INTRAVENOUS
  Filled 2016-12-25: qty 200

## 2016-12-25 MED ORDER — ENOXAPARIN SODIUM 40 MG/0.4ML ~~LOC~~ SOLN
40.0000 mg | Freq: Every day | SUBCUTANEOUS | Status: DC
  Administered 2016-12-25 – 2016-12-31 (×6): 40 mg via SUBCUTANEOUS
  Filled 2016-12-25 (×7): qty 0.4

## 2016-12-25 NOTE — ED Notes (Signed)
[facility]PA Provider at bedside. AWARE OF ADMISSION

## 2016-12-25 NOTE — H&P (Signed)
Victoria Everett FGH:[id number] DOB: Aug 19, 1985 DOA: 12/25/2016     PCP: System, Pcp Not In   Outpatient Specialists: none  Patient coming from:   home Lives  With friends    Chief Complaint: left leg pain and swelling  HPI: Victoria Everett is a 31 y.o. female with medical history significant of DM 2 on insulin    Presented with worsening left foot pain subjective fevers and generalized body aches for the past 5 days.   Denies sore throat or URI symptoms denies hypoglycemia . She has been admitted for diabetic foot ulcer or cellulitis on the left in the beginning of October 2018 after she stepped on a sharp object and developed an ulceration on the left plantar 5th metatarsal head.    She was diagnosed with diabetic foot ulcer MRI of the foot October did not show any osteomyelitis A she initially treated vancomycin and Zosyn -creatinine was noted to go up and she was switched to ceftriaxone was transitioned to Keflex and doxycycline on discharge on October 7 She had a follow-up appointment on 17th of October to get Center at that time ABIs were obtained showing bilaterally 1.18  Thought that her wound was healing nicely and she was discharged from wound care clinic.  Regarding pertinent Chronic problems: Hemoglobin A1c 13.3 possible peripheral neuropathy patient was discharged on 70/30 insulin could not afford Lantus She was also diagnosed with anemia of chronic disease K has hypertension while in hospital and was started on ACE inhibitor but creatinine went up and this was discontinued and she was discharged on amlodipine  IN ER:  Temp (24hrs), Avg:99.3 F (37.4 C), Min:98.5 F (36.9 C), Max:100.8 F (38.2 C)      on arrival  ED Triage Vitals  Enc Vitals Group     BP 12/25/16 1556 (!) 159/98     Pulse Rate 12/25/16 1556 (!) 111     Resp 12/25/16 1556 18     Temp 12/25/16 1556 98.5 F (36.9 C)     Temp Source 12/25/16 1556 Oral     SpO2 12/25/16 1556 97 %     Weight 12/25/16  1757 243 lb (110.2 kg)     Height 12/25/16 1757 _0  (1.803 m)     Head Circumference --      Peak Flow --      Pain Score 12/25/16 1559 10     Pain Loc --      Pain Edu? --      Excl. in [place]? --     Latest RR 14  99% Hr 103   153/86 La 1.11 Na 139 K3.5 BUN 18 Cr0.87alb 3.1 WBC 11.7 Hg 9.5 plt 275  Influenza neg Plain film of the foot showing no osteo Following Medications were ordered in ER: Medications  sodium chloride 0.9 % bolus 1,000 mL (0 mLs Intravenous Stopped 12/25/16 1829)    And  sodium chloride 0.9 % bolus 1,000 mL (0 mLs Intravenous Stopped 12/25/16 2023)    And  sodium chloride 0.9 % bolus 1,000 mL (0 mLs Intravenous Stopped 12/25/16 2023)    And  sodium chloride 0.9 % bolus 500 mL (0 mLs Intravenous Stopped 12/25/16 2024)  piperacillin-tazobactam (ZOSYN) IVPB 3.375 g (0 g Intravenous Stopped 12/25/16 1940)  vancomycin (VANCOCIN) IVPB 1000 mg/200 mL premix (0 mg Intravenous Stopped 12/25/16 1950)  HYDROmorphone (DILAUDID) injection 0.5 mg (0.5 mg Intravenous Given 12/25/16 1846)  ondansetron (ZOFRAN) injection 4 mg (4 mg Intravenous Given 12/25/16 1845)  acetaminophen (TYLENOL) tablet 650 mg (650 mg Oral Given 12/25/16 1846)     Hospitalist was called for admission for left foot diabetic ulcer with cellulitis and sepsis  Review of Systems:    Pertinent positives include:  Fevers, chills, fatigue, Leg and foot pain Constitutional:  No weight loss, night sweats weight loss  HEENT:  No headaches, Difficulty swallowing,Tooth/dental problems,Sore throat,  No sneezing, itching, ear ache, nasal congestion, post nasal drip,  Cardio-vascular:  No chest pain, Orthopnea, PND, anasarca, dizziness, palpitations.no Bilateral lower extremity swelling  GI:  No heartburn, indigestion, abdominal pain, nausea, vomiting, diarrhea, change in bowel habits, loss of appetite, melena, blood in stool, hematemesis Resp:  no shortness of breath at rest. No dyspnea on exertion, No excess  mucus, no productive cough, No non-productive cough, No coughing up of blood.No change in color of mucus.No wheezing. Skin:  no rash or lesions. No jaundice GU:  no dysuria, change in color of urine, no urgency or frequency. No straining to urinate.  No flank pain.  Musculoskeletal:  No joint pain or no joint swelling. No decreased range of motion. No back pain.  Psych:  No change in mood or affect. No depression or anxiety. No memory loss.  Neuro: no localizing neurological complaints, no tingling, no weakness, no double vision, no gait abnormality, no slurred speech, no confusion  As per HPI otherwise 10 point review of systems negative.   Past Medical History: History reviewed. No pertinent past medical history. Past Surgical History:  Procedure Laterality Date  . TONSILLECTOMY       Social History:  Ambulatory   Independently    reports that she has been smoking.  She has never used smokeless tobacco. She reports that she drinks alcohol. Her drug history is not on file.  Allergies:  No Known Allergies     Family History:   Family History  Problem Relation Age of Onset  . Diabetes Mellitus II Mother     Medications: Prior to Admission medications   Medication Sig Start Date End Date Taking? Authorizing Provider  acetaminophen (TYLENOL) 500 MG tablet Take 1,000 mg by mouth every 6 (six) hours as needed for mild pain or headache.   Yes [provider]  ibuprofen (ADVIL,MOTRIN) 200 MG tablet Take 400 mg by mouth every 6 (six) hours as needed for headache or moderate pain.   Yes [provider]  insulin aspart protamine- aspart (NOVOLOG MIX 70/30) (70-30) 100 UNIT/ML injection Inject 0.3 mLs (30 Units total) into the skin 2 (two) times daily with a meal. 12/10/16  Yes Noel, Tiffany S, PA-C  lisinopril-hydrochlorothiazide (PRINZIDE,ZESTORETIC) 10-12.5 MG tablet Take 1 tablet by mouth daily. 12/10/16  Yes Ena Dawley, Tiffany S, PA-C  amLODipine (NORVASC) 5 MG  tablet Take 1 tablet (5 mg total) by mouth daily. Patient not taking: Reported on 12/10/2016 11/28/16   Bonnielee Haff, MD  blood glucose meter kit and supplies KIT Dispense based on patient and insurance preference. Use up to four times daily as directed. (FOR ICD-9 250.00, 250.01). Patient not taking: Reported on 12/25/2016 11/28/16   Bonnielee Haff, MD  cephALEXin (KEFLEX) 500 MG capsule Take 1 capsule (500 mg total) by mouth 4 (four) times daily. Patient not taking: Reported on 12/10/2016 11/21/16   Ashley Murrain, NP  HYDROcodone-acetaminophen (NORCO/VICODIN) 5-325 MG tablet Take 1 tablet by mouth every 4 (four) hours as needed for severe pain. Patient not taking: Reported on 12/10/2016 11/28/16   Bonnielee Haff, MD  Insulin Syringe-Needle U-100 30G 0.5  ML MISC Use as directed Patient not taking: Reported on 12/25/2016 11/28/16   Bonnielee Haff, MD  metFORMIN (GLUCOPHAGE) 500 MG tablet Take 1 tablet (500 mg total) by mouth 2 (two) times daily with a meal. Patient not taking: Reported on 12/10/2016 11/21/16   Ashley Murrain, NP    Physical Exam: Patient Vitals for the past 24 hrs:  BP Temp Temp src Pulse Resp SpO2 Height Weight  12/25/16 2025 - 98.5 F (36.9 C) Oral - - - - -  12/25/16 2015 - - - (!) 103 14 99 % - -  12/25/16 1930 (!) 153/86 - - (!) 105 14 100 % - -  12/25/16 1915 (!) 157/90 - - (!) 104 17 100 % - -  12/25/16 1900 - - - (!) 107 - 98 % - -  12/25/16 1807 (!) 161/93 (!) 100.8 F (38.2 C) Oral (!) 108 20 100 % - -  12/25/16 1757 - - - - - - _0  (1.803 m) 110.2 kg (243 lb)  12/25/16 1556 (!) 159/98 98.5 F (36.9 C) Oral (!) 111 18 97 % - -    1. General:  in No Acute distress  well  -appearing 2. Psychological: Alert and   Oriented 3. Head/ENT:     Dry Mucous Membranes                          Head Non traumatic, neck supple                           Poor Dentition 4. SKIN:   decreased Skin turgor,  Skin clean Dry  foul-smelling draining ulcer under  fifth metatarsal  head on the left    5. Heart: Regular rate and rhythm no  Murmur, no Rub or gallop 6. Lungs:  no wheezes or crackles   7. Abdomen: Soft,   non-tender, Non distended  obese   8. Lower extremities: no clubbing, cyanosis, or edema 9. Neurologically Grossly intact, moving all 4 extremities equally   10. MSK: Normal range of motion   body mass index is 33.89 kg/m.  Labs on Admission:   Labs on Admission: I have personally reviewed following labs and imaging studies  CBC:  Recent Labs Lab 12/25/16 1750  WBC 11.7*  NEUTROABS 9.7*  HGB 9.5*  HCT 30.0*  MCV 81.1  PLT 810   Basic Metabolic Panel:  Recent Labs Lab 12/25/16 1750  NA 139  K 3.5  CL 105  CO2 25  GLUCOSE 83  BUN 18  CREATININE 0.87  CALCIUM 8.6*   GFR: Estimated Creatinine Clearance: 126.9 mL/min (by C-G formula based on SCr of 0.87 mg/dL). Liver Function Tests:  Recent Labs Lab 12/25/16 1750  AST 20  ALT 13*  ALKPHOS 68  BILITOT 0.7  PROT 7.5  ALBUMIN 3.1*   No results for input(s): LIPASE, AMYLASE in the last 168 hours. No results for input(s): AMMONIA in the last 168 hours. Coagulation Profile: No results for input(s): INR, PROTIME in the last 168 hours. Cardiac Enzymes: No results for input(s): CKTOTAL, CKMB, CKMBINDEX, TROPONINI in the last 168 hours. BNP (last 3 results) No results for input(s): PROBNP in the last 8760 hours. HbA1C: No results for input(s): HGBA1C in the last 72 hours. CBG:  Recent Labs Lab 12/25/16 1606  GLUCAP 146*   Lipid Profile: No results for input(s): CHOL, HDL, LDLCALC, TRIG, CHOLHDL, LDLDIRECT in the  last 72 hours. Thyroid Function Tests: No results for input(s): TSH, T4TOTAL, FREET4, T3FREE, THYROIDAB in the last 72 hours. Anemia Panel: No results for input(s): VITAMINB12, FOLATE, FERRITIN, TIBC, IRON, RETICCTPCT in the last 72 hours. Urine analysis:    Component Value Date/Time   COLORURINE STRAW (A) 12/25/2016 1734   APPEARANCEUR CLEAR  12/25/2016 1734   LABSPEC 1.011 12/25/2016 1734   PHURINE 7.0 12/25/2016 1734   GLUCOSEU NEGATIVE 12/25/2016 1734   HGBUR MODERATE (A) 12/25/2016 1734   BILIRUBINUR NEGATIVE 12/25/2016 1734   KETONESUR NEGATIVE 12/25/2016 1734   PROTEINUR 100 (A) 12/25/2016 1734   NITRITE NEGATIVE 12/25/2016 1734   LEUKOCYTESUR NEGATIVE 12/25/2016 1734   Sepsis Labs: _0 (procalcitonin:4,lacticidven:4) )No results found for this or any previous visit (from the past 240 hour(s)).    UA   no evidence of UTI     Lab Results  Component Value Date   HGBA1C 13.3 (H) 11/25/2016    Estimated Creatinine Clearance: 126.9 mL/min (by C-G formula based on SCr of 0.87 mg/dL).  BNP (last 3 results) No results for input(s): PROBNP in the last 8760 hours.   ECG REPORT  Independently reviewed Rate:107  Rhythm: Sinus tachycardia ST&T Change: No acute ischemic changes  QTC 369  Filed Weights   12/25/16 1757  Weight: 110.2 kg (243 lb)     Cultures: No results found for: SDES, SPECREQUEST, CULT, REPTSTATUS   Radiological Exams on Admission: Dg Foot Complete Left  Result Date: 12/25/2016 CLINICAL DATA:  31 year old female with left foot pain and ulcer along the plantar surface for 1 month. EXAM: LEFT FOOT - COMPLETE 3+ VIEW COMPARISON:  Radiographs 11/21/2016 and MRI 11/25/2016. FINDINGS: There is no evidence of fracture or dislocation. There is no evidence of arthropathy or other focal bone abnormality. Soft tissue swelling is noted about the distal metatarsal head. IMPRESSION: Soft tissue swelling about the fifth metatarsal head without underlying osseous abnormality. Please note, plain films have limited sensitivity for detection of early osteomyelitis. Electronically Signed   By: Kristopher Oppenheim M.D.   On: 12/25/2016 18:28    Chart has been reviewed    Assessment/Plan   31 y.o. female with medical history significant of DM 2 on insulin     Admitted for  left foot diabetic ulcer with  cellulitis and sepsis   Present on Admission: . Cellulitis of foot, left --admit per cellulitis protocol will     continue current antibiotic choice vancomycin and Zosyn for now she has improved on this regimen and past      plain films showed:   no evidence of air  no evidence of osteomyelitis   no    foreign   objects       Will obtain MRSA screening,      obtain blood cultures       further antibiotic adjustment pending above results If not improving would recommend orthopedics consult in morning . Sepsis ([place]) source being most likely diabetic foot ulcer but viral illness cannot be ruled out influenza PCR -15 respiratory panel await results of blood cultures will rehydrate . Normochromic normocytic anemia Dm 2 -  - Order  Moderate  SSI   -  switch to   Lantus 25 units,  -  check TSH and HgA1C  - Hold by mouth medications  Left leg edema given significant swelling and recent admission will evaluate for possible DVT   Other plan as per orders.  DVT prophylaxis:  SCD    Code Status:  FULL  CODE as per patient    Family Communication:   Family not  at  Bedside    Disposition Plan:       To home once workup is complete and patient is stable                          Diabetes coordinator                            Consults called: none   Admission status:  inpatient     Level of care    tele        I have spent a total of 57bmin on this admission   Safiya Girdler 12/25/2016, 10:13 PM    [facility]  Pager [phone]   after 2 AM please page floor coverage PA If 7AM-7PM, please contact the day team taking care of the patient  [email or web address]  Password [email or web address]

## 2016-12-25 NOTE — ED Triage Notes (Signed)
Patient here with complaints of generalized body aches and left foot pain since Monday. States that she has an ulcer under her left foot. Ambulatory with lip. Recent diagnosed with type 2 diabetes.

## 2016-12-25 NOTE — ED Provider Notes (Signed)
[facility] Provider Note   CSN: [id number] Arrival date & time: 12/25/16  1548     History   Chief Complaint Chief Complaint  Patient presents with  . Generalized Body Aches  . Foot Pain    HPI   Blood pressure (!) 159/98, pulse (!) 111, temperature 98.5 F (36.9 C), temperature source Oral, resp. rate 18, SpO2 97 %.  Victoria Everett is a 31 y.o. female with past medical history significant for insulin-dependent diabetes, recently admitted for cellulitis of left foot, had been following at the wound care center and was released because the ulcer was healing well and not infected.  She states that the pain has been increasing steadily over the course of the last week she said the pain radiates from the left foot all the way up all over her body.  She has associated chills and Reiger's.  No fever, chills, nausea, vomiting, cough, rhinorrhea, sore throat, headache.  No known sick contacts.  Sugars have been running in the normal.  Past Medical History:  Diagnosis Date  . Diabetes mellitus without complication ([place])   . Hyperthyroidism     Patient Active Problem List   Diagnosis Date Noted  . Sepsis ([place]) 12/25/2016  . DM2 (diabetes mellitus, type 2) ([place]) 12/25/2016  . Cellulitis of foot, left 11/25/2016  . New onset type 2 diabetes mellitus ([place]) 11/25/2016  . Elevated blood pressure reading 11/25/2016  . Normochromic normocytic anemia 11/25/2016  . Cellulitis 11/25/2016    Past Surgical History:  Procedure Laterality Date  . TONSILLECTOMY      OB History    No data available       Home Medications    Prior to Admission medications   Medication Sig Start Date End Date Taking? Authorizing Provider  acetaminophen (TYLENOL) 500 MG tablet Take 1,000 mg by mouth every 6 (six) hours as needed for mild pain or headache.   Yes [provider]  ibuprofen (ADVIL,MOTRIN) 200 MG tablet Take 400 mg by mouth every 6 (six) hours  as needed for headache or moderate pain.   Yes [provider]  insulin aspart protamine- aspart (NOVOLOG MIX 70/30) (70-30) 100 UNIT/ML injection Inject 0.3 mLs (30 Units total) into the skin 2 (two) times daily with a meal. 12/10/16  Yes Noel, Tiffany S, PA-C  lisinopril-hydrochlorothiazide (PRINZIDE,ZESTORETIC) 10-12.5 MG tablet Take 1 tablet by mouth daily. 12/10/16  Yes Ena Dawley, Tiffany S, PA-C  amLODipine (NORVASC) 5 MG tablet Take 1 tablet (5 mg total) by mouth daily. Patient not taking: Reported on 12/10/2016 11/28/16   Bonnielee Haff, MD  blood glucose meter kit and supplies KIT Dispense based on patient and insurance preference. Use up to four times daily as directed. (FOR ICD-9 250.00, 250.01). Patient not taking: Reported on 12/25/2016 11/28/16   Bonnielee Haff, MD  cephALEXin (KEFLEX) 500 MG capsule Take 1 capsule (500 mg total) by mouth 4 (four) times daily. Patient not taking: Reported on 12/10/2016 11/21/16   Ashley Murrain, NP  HYDROcodone-acetaminophen (NORCO/VICODIN) 5-325 MG tablet Take 1 tablet by mouth every 4 (four) hours as needed for severe pain. Patient not taking: Reported on 12/10/2016 11/28/16   Bonnielee Haff, MD  Insulin Syringe-Needle U-100 30G 0.5 ML MISC Use as directed Patient not taking: Reported on 12/25/2016 11/28/16   Bonnielee Haff, MD  metFORMIN (GLUCOPHAGE) 500 MG tablet Take 1 tablet (500 mg total) by mouth 2 (two) times daily with a meal. Patient not taking: Reported on  12/10/2016 11/21/16   Ashley Murrain, NP    Family History Family History  Problem Relation Age of Onset  . Diabetes Mellitus II Mother     Social History Social History  Substance Use Topics  . Smoking status: Former Research scientist (life sciences)  . Smokeless tobacco: Never Used  . Alcohol use No     Allergies   Patient has no known allergies.   Review of Systems Review of Systems  A complete review of systems was obtained and all systems are negative except as noted in the HPI and PMH.    Physical Exam Updated Vital Signs BP (!) 142/92 (BP Location: Right Arm)   Pulse (!) 103   Temp 98.6 F (37 C) (Oral)   Resp 15   Ht 5' 11"  (1.803 m)   Wt 110 kg (242 lb 8.1 oz)   LMP 12/19/2016   SpO2 100%   BMI 33.82 kg/m   Physical Exam  Constitutional: She is oriented to person, place, and time. She appears well-developed and well-nourished. No distress.  Tearful, appears acutely uncomfortable.  HENT:  Head: Normocephalic and atraumatic.  Mouth/Throat: Oropharynx is clear and moist.  Eyes: Pupils are equal, round, and reactive to light. Conjunctivae and EOM are normal.  Neck: Normal range of motion.  Cardiovascular: Normal rate, regular rhythm and intact distal pulses.   Tachycardic, regular  Pulmonary/Chest: Effort normal and breath sounds normal. No respiratory distress. She has no wheezes. She has no rales. She exhibits no tenderness.  Abdominal: Soft. There is no tenderness.  Musculoskeletal: Normal range of motion.  Neurological: She is alert and oriented to person, place, and time.  Skin: She is not diaphoretic.  Ulceration with scant discharge, foul-smelling as photographed.  Left lower extremity with mild edema and warmth to knee.  No focal Homans sign.  Psychiatric: She has a normal mood and affect.  Nursing note and vitals reviewed.      ED Treatments / Results  Labs (all labs ordered are listed, but only abnormal results are displayed) Labs Reviewed  COMPREHENSIVE METABOLIC PANEL - Abnormal; Notable for the following:       Result Value   Calcium 8.6 (*)    Albumin 3.1 (*)    ALT 13 (*)    All other components within normal limits  CBC WITH DIFFERENTIAL/PLATELET - Abnormal; Notable for the following:    WBC 11.7 (*)    RBC 3.70 (*)    Hemoglobin 9.5 (*)    HCT 30.0 (*)    MCH 25.7 (*)    Neutro Abs 9.7 (*)    All other components within normal limits  URINALYSIS, ROUTINE W REFLEX MICROSCOPIC - Abnormal; Notable for the following:    Color,  Urine STRAW (*)    Hgb urine dipstick MODERATE (*)    Protein, ur 100 (*)    Bacteria, UA RARE (*)    Squamous Epithelial / LPF 0-5 (*)    All other components within normal limits  SEDIMENTATION RATE - Abnormal; Notable for the following:    Sed Rate 95 (*)    All other components within normal limits  CBG MONITORING, ED - Abnormal; Notable for the following:    Glucose-Capillary 146 (*)    All other components within normal limits  I-STAT BETA HCG BLOOD, ED ([facility], [facility], [facility] ONLY) - Abnormal; Notable for the following:    I-stat hCG, quantitative 5.1 (*)    All other components within normal limits  CULTURE, BLOOD (ROUTINE X 2)  CULTURE, BLOOD (ROUTINE X 2)  MRSA PCR SCREENING  RESPIRATORY PANEL BY PCR  INFLUENZA PANEL BY PCR (TYPE A & B)  PREGNANCY, URINE  GLUCOSE, CAPILLARY  C-REACTIVE PROTEIN  D-DIMER, QUANTITATIVE (NOT AT [facility])  PROCALCITONIN  HEMOGLOBIN A1C  HIV ANTIBODY (ROUTINE TESTING)  PREALBUMIN  MAGNESIUM  PHOSPHORUS  TSH  COMPREHENSIVE METABOLIC PANEL  CBC  I-STAT CG4 LACTIC ACID, ED  I-STAT CG4 LACTIC ACID, ED    EKG  EKG Interpretation  Date/Time:  Saturday December 25 2016 19:07:13 EDT Ventricular Rate:  107 PR Interval:    QRS Duration: 70 QT Interval:  276 QTC Calculation: 369 R Axis:   86 Text Interpretation:  Sinus tachycardia Sinus pause Probable left atrial enlargement Low voltage, precordial leads Probable anteroseptal infarct, old Borderline T abnormalities, inferior leads No old tracing to compare Confirmed by Sherwood Gambler [phone]) on 12/25/2016 7:19:46 PM       Radiology Dg Foot Complete Left  Result Date: 12/25/2016 CLINICAL DATA:  31 year old female with left foot pain and ulcer along the plantar surface for 1 month. EXAM: LEFT FOOT - COMPLETE 3+ VIEW COMPARISON:  Radiographs 11/21/2016 and MRI 11/25/2016. FINDINGS: There is no evidence of fracture or dislocation. There is no evidence of arthropathy or other focal bone abnormality. Soft  tissue swelling is noted about the distal metatarsal head. IMPRESSION: Soft tissue swelling about the fifth metatarsal head without underlying osseous abnormality. Please note, plain films have limited sensitivity for detection of early osteomyelitis. Electronically Signed   By: Kristopher Oppenheim M.D.   On: 12/25/2016 18:28    Procedures Procedures (including critical care time)  Medications Ordered in ED Medications  insulin aspart (novoLOG) injection 0-5 Units (0 Units Subcutaneous Not Given 12/25/16 2316)  insulin aspart (novoLOG) injection 0-15 Units (not administered)  acetaminophen (TYLENOL) tablet 650 mg (not administered)    Or  acetaminophen (TYLENOL) suppository 650 mg (not administered)  HYDROcodone-acetaminophen (NORCO/VICODIN) 5-325 MG per tablet 1-2 tablet (not administered)  ondansetron (ZOFRAN) tablet 4 mg ( Oral See Alternative 12/25/16 2335)    Or  ondansetron (ZOFRAN) injection 4 mg (4 mg Intravenous Given 12/25/16 2335)  enoxaparin (LOVENOX) injection 40 mg (40 mg Subcutaneous Given 12/25/16 2326)  0.9 %  sodium chloride infusion ( Intravenous New Bag/Given 12/25/16 2327)  lisinopril (PRINIVIL,ZESTRIL) tablet 10 mg (not administered)  insulin glargine (LANTUS) injection 25 Units (not administered)  piperacillin-tazobactam (ZOSYN) IVPB 3.375 g (not administered)  vancomycin (VANCOCIN) IVPB 1000 mg/200 mL premix (1,000 mg Intravenous New Bag/Given 12/25/16 2327)  vancomycin (VANCOCIN) 1,250 mg in sodium chloride 0.9 % 250 mL IVPB (not administered)  sodium chloride 0.9 % bolus 1,000 mL (0 mLs Intravenous Stopped 12/25/16 1829)    And  sodium chloride 0.9 % bolus 1,000 mL (0 mLs Intravenous Stopped 12/25/16 2023)    And  sodium chloride 0.9 % bolus 1,000 mL (0 mLs Intravenous Stopped 12/25/16 2023)    And  sodium chloride 0.9 % bolus 500 mL (0 mLs Intravenous Stopped 12/25/16 2024)  piperacillin-tazobactam (ZOSYN) IVPB 3.375 g (0 g Intravenous Stopped 12/25/16 1940)  vancomycin  (VANCOCIN) IVPB 1000 mg/200 mL premix (0 mg Intravenous Stopped 12/25/16 1950)  HYDROmorphone (DILAUDID) injection 0.5 mg (0.5 mg Intravenous Given 12/25/16 1846)  ondansetron (ZOFRAN) injection 4 mg (4 mg Intravenous Given 12/25/16 1845)  acetaminophen (TYLENOL) tablet 650 mg (650 mg Oral Given 12/25/16 1846)  HYDROmorphone (DILAUDID) injection 0.5 mg (0.5 mg Intravenous Given 12/25/16 2335)     Initial Impression / Assessment and Plan /  ED Course  I have reviewed the triage vital signs and the nursing notes.  Pertinent labs & imaging results that were available during my care of the patient were reviewed by me and considered in my medical decision making (see chart for details).     Vitals:   12/25/16 2200 12/25/16 2215 12/25/16 2230 12/25/16 2307  BP: (!) 142/85  (!) 147/96 (!) 142/92  Pulse: 99 100 97 (!) 103  Resp: 20 15    Temp:    98.6 F (37 C)  TempSrc:    Oral  SpO2: 99% 99% 99% 100%  Weight:    110 kg (242 lb 8.1 oz)  Height:    5' 11"  (1.803 m)    Medications  insulin aspart (novoLOG) injection 0-5 Units (0 Units Subcutaneous Not Given 12/25/16 2316)  insulin aspart (novoLOG) injection 0-15 Units (not administered)  acetaminophen (TYLENOL) tablet 650 mg (not administered)    Or  acetaminophen (TYLENOL) suppository 650 mg (not administered)  HYDROcodone-acetaminophen (NORCO/VICODIN) 5-325 MG per tablet 1-2 tablet (not administered)  ondansetron (ZOFRAN) tablet 4 mg ( Oral See Alternative 12/25/16 2335)    Or  ondansetron (ZOFRAN) injection 4 mg (4 mg Intravenous Given 12/25/16 2335)  enoxaparin (LOVENOX) injection 40 mg (40 mg Subcutaneous Given 12/25/16 2326)  0.9 %  sodium chloride infusion ( Intravenous New Bag/Given 12/25/16 2327)  lisinopril (PRINIVIL,ZESTRIL) tablet 10 mg (not administered)  insulin glargine (LANTUS) injection 25 Units (not administered)  piperacillin-tazobactam (ZOSYN) IVPB 3.375 g (not administered)  vancomycin (VANCOCIN) IVPB 1000 mg/200 mL premix  (1,000 mg Intravenous New Bag/Given 12/25/16 2327)  vancomycin (VANCOCIN) 1,250 mg in sodium chloride 0.9 % 250 mL IVPB (not administered)  sodium chloride 0.9 % bolus 1,000 mL (0 mLs Intravenous Stopped 12/25/16 1829)    And  sodium chloride 0.9 % bolus 1,000 mL (0 mLs Intravenous Stopped 12/25/16 2023)    And  sodium chloride 0.9 % bolus 1,000 mL (0 mLs Intravenous Stopped 12/25/16 2023)    And  sodium chloride 0.9 % bolus 500 mL (0 mLs Intravenous Stopped 12/25/16 2024)  piperacillin-tazobactam (ZOSYN) IVPB 3.375 g (0 g Intravenous Stopped 12/25/16 1940)  vancomycin (VANCOCIN) IVPB 1000 mg/200 mL premix (0 mg Intravenous Stopped 12/25/16 1950)  HYDROmorphone (DILAUDID) injection 0.5 mg (0.5 mg Intravenous Given 12/25/16 1846)  ondansetron (ZOFRAN) injection 4 mg (4 mg Intravenous Given 12/25/16 1845)  acetaminophen (TYLENOL) tablet 650 mg (650 mg Oral Given 12/25/16 1846)  HYDROmorphone (DILAUDID) injection 0.5 mg (0.5 mg Intravenous Given 12/25/16 2335)    Vinie Ureste is 31 y.o. female presenting with pain to left foot ulceration radiating through her entire body.  Patient states that she was recently admitted for similar, she appears to have a cellulitis on the lower extremity at this point.  She is afebrile but tachycardic, very uncomfortable appearing CBG within normal limits.  Patient febrile to 100.8, tachycardic.  Code sepsis initiated, Vanc Zosyn initiated.  X-ray of foot pending to evaluate for osteomyelitis.  I-STAT beta-hCG is likely a lab error urine pregnancy negative.  Patient has a normal lactic acid level.  A very mild leukocytosis of blood cultures initiated  Final Clinical Impressions(s) / ED Diagnoses   Final diagnoses:  Sepsis affecting skin ([place])  Diabetic ulcer of left foot associated with other specified diabetes mellitus, unspecified part of foot, unspecified ulcer stage [facility])    New Prescriptions Current Discharge Medication List       Waynetta Pean 12/25/16 2347    Sherwood Gambler,  MD 12/26/16 1653

## 2016-12-25 NOTE — Progress Notes (Signed)
Pharmacy Antibiotic Note  Victoria Everett is a 31 y.o. female c/o left leg pain and swelling admitted on 12/25/2016 with cellulitis.  Pharmacy has been consulted for zosyn and vancomycin dosing.   Plan: Zosyn 3.375g IV q8h (4 hour infusion).  Vancomycin 1 Gm + 1 Gm = 2 Gm load then 1250 mg IV q12h for est AUC=494  Goal AUC 400-500 F/u scr/cultures  Height: 5\' 11"  (180.3 cm) Weight: 242 lb 8.1 oz (110 kg) IBW/kg (Calculated) : 70.8  Temp (24hrs), Avg:99.1 F (37.3 C), Min:98.5 F (36.9 C), Max:100.8 F (38.2 C)   Recent Labs Lab 12/25/16 1750 12/25/16 1756 12/25/16 1942  WBC 11.7*  --   --   CREATININE 0.87  --   --   LATICACIDVEN  --  0.94 1.11    Estimated Creatinine Clearance: 126.8 mL/min (by C-G formula based on SCr of 0.87 mg/dL).    No Known Allergies  Antimicrobials this admission: 11/3 zosyn >>  11/3 vancomycin >>   Dose adjustments this admission:   Microbiology results:  BCx:   UCx:    Sputum:    MRSA PCR:   Thank you for allowing pharmacy to be a part of this patient's care.  Lorenza EvangelistGreen, Vence Lalor R 12/25/2016 11:16 PM

## 2016-12-26 ENCOUNTER — Other Ambulatory Visit: Payer: Self-pay

## 2016-12-26 ENCOUNTER — Inpatient Hospital Stay (HOSPITAL_COMMUNITY): Payer: Self-pay

## 2016-12-26 DIAGNOSIS — R609 Edema, unspecified: Secondary | ICD-10-CM

## 2016-12-26 LAB — COMPREHENSIVE METABOLIC PANEL
ALBUMIN: 2.4 g/dL — AB (ref 3.5–5.0)
ALK PHOS: 77 U/L (ref 38–126)
ALT: 15 U/L (ref 14–54)
AST: 23 U/L (ref 15–41)
Anion gap: 8 (ref 5–15)
BILIRUBIN TOTAL: 0.8 mg/dL (ref 0.3–1.2)
BUN: 12 mg/dL (ref 6–20)
CO2: 23 mmol/L (ref 22–32)
CREATININE: 0.82 mg/dL (ref 0.44–1.00)
Calcium: 7.6 mg/dL — ABNORMAL LOW (ref 8.9–10.3)
Chloride: 105 mmol/L (ref 101–111)
GFR calc Af Amer: >60 mL/min (ref >60)
GLUCOSE: 103 mg/dL — AB (ref 65–99)
Potassium: 3.3 mmol/L — ABNORMAL LOW (ref 3.5–5.1)
Sodium: 136 mmol/L (ref 135–145)
TOTAL PROTEIN: 5.9 g/dL — AB (ref 6.5–8.1)

## 2016-12-26 LAB — CBC
HEMATOCRIT: 25.5 % — AB (ref 36.0–46.0)
Hemoglobin: 8.1 g/dL — ABNORMAL LOW (ref 12.0–15.0)
MCH: 25.8 pg — ABNORMAL LOW (ref 26.0–34.0)
MCHC: 31.8 g/dL (ref 30.0–36.0)
MCV: 81.2 fL (ref 78.0–100.0)
PLATELETS: 223 10*3/uL (ref 150–400)
RBC: 3.14 MIL/uL — ABNORMAL LOW (ref 3.87–5.11)
RDW: 12.7 % (ref 11.5–15.5)
WBC: 9.8 10*3/uL (ref 4.0–10.5)

## 2016-12-26 LAB — RESPIRATORY PANEL BY PCR
ADENOVIRUS-RVPPCR: NOT DETECTED
Bordetella pertussis: NOT DETECTED
CORONAVIRUS NL63-RVPPCR: NOT DETECTED
CORONAVIRUS OC43-RVPPCR: NOT DETECTED
Chlamydophila pneumoniae: NOT DETECTED
Coronavirus 229E: NOT DETECTED
Coronavirus HKU1: NOT DETECTED
INFLUENZA A-RVPPCR: NOT DETECTED
INFLUENZA B-RVPPCR: NOT DETECTED
METAPNEUMOVIRUS-RVPPCR: NOT DETECTED
Mycoplasma pneumoniae: NOT DETECTED
PARAINFLUENZA VIRUS 1-RVPPCR: NOT DETECTED
PARAINFLUENZA VIRUS 3-RVPPCR: NOT DETECTED
PARAINFLUENZA VIRUS 4-RVPPCR: NOT DETECTED
Parainfluenza Virus 2: NOT DETECTED
RESPIRATORY SYNCYTIAL VIRUS-RVPPCR: NOT DETECTED
RHINOVIRUS / ENTEROVIRUS - RVPPCR: NOT DETECTED

## 2016-12-26 LAB — GLUCOSE, CAPILLARY
GLUCOSE-CAPILLARY: 169 mg/dL — AB (ref 65–99)
GLUCOSE-CAPILLARY: 128 mg/dL — AB (ref 65–99)
Glucose-Capillary: 105 mg/dL — ABNORMAL HIGH (ref 65–99)
Glucose-Capillary: 142 mg/dL — ABNORMAL HIGH (ref 65–99)

## 2016-12-26 LAB — MRSA PCR SCREENING: MRSA by PCR: NEGATIVE

## 2016-12-26 LAB — HEMOGLOBIN A1C
Hgb A1c MFr Bld: 10 % — ABNORMAL HIGH (ref 4.8–5.6)
MEAN PLASMA GLUCOSE: 240.3 mg/dL

## 2016-12-26 LAB — C-REACTIVE PROTEIN: CRP: 12 mg/dL — AB (ref <1.0)

## 2016-12-26 LAB — PREALBUMIN: PREALBUMIN: 18.4 mg/dL (ref 18–38)

## 2016-12-26 LAB — MAGNESIUM: Magnesium: 1.6 mg/dL — ABNORMAL LOW (ref 1.7–2.4)

## 2016-12-26 LAB — TSH: TSH: 2.426 u[IU]/mL (ref 0.350–4.500)

## 2016-12-26 LAB — HIV ANTIBODY (ROUTINE TESTING W REFLEX): HIV Screen 4th Generation wRfx: NONREACTIVE

## 2016-12-26 LAB — D-DIMER, QUANTITATIVE: D-Dimer, Quant: 0.66 ug/mL-FEU — ABNORMAL HIGH (ref 0.00–0.50)

## 2016-12-26 LAB — PROCALCITONIN: Procalcitonin: <0.1 ng/mL

## 2016-12-26 LAB — PHOSPHORUS: Phosphorus: 2.3 mg/dL — ABNORMAL LOW (ref 2.5–4.6)

## 2016-12-26 MED ORDER — POTASSIUM CHLORIDE CRYS ER 20 MEQ PO TBCR
40.0000 meq | EXTENDED_RELEASE_TABLET | Freq: Once | ORAL | Status: AC
  Administered 2016-12-26: 40 meq via ORAL
  Filled 2016-12-26: qty 2

## 2016-12-26 MED ORDER — MORPHINE SULFATE (PF) 4 MG/ML IV SOLN
2.0000 mg | INTRAVENOUS | Status: DC | PRN
  Administered 2016-12-26 – 2016-12-29 (×9): 2 mg via INTRAVENOUS
  Filled 2016-12-26 (×9): qty 1

## 2016-12-26 MED ORDER — SODIUM CHLORIDE 0.9 % IV SOLN
510.0000 mg | INTRAVENOUS | Status: DC
  Administered 2016-12-26: 510 mg via INTRAVENOUS
  Filled 2016-12-26: qty 17

## 2016-12-26 MED ORDER — FUROSEMIDE 10 MG/ML IJ SOLN
20.0000 mg | Freq: Once | INTRAMUSCULAR | Status: AC
  Administered 2016-12-26: 20 mg via INTRAVENOUS
  Filled 2016-12-26: qty 2

## 2016-12-26 NOTE — Progress Notes (Signed)
Left lower extremity venous duplex has been completed. Negative for DVT. Results were given to the patient's nurse, Irving BurtonEmily.  12/26/16 2:00 PM Olen CordialGreg Lonie Newsham RVT

## 2016-12-26 NOTE — Progress Notes (Addendum)
PROGRESS NOTE    Victoria Everett  ZOX:[id number] DOB: [birth year] DOA: 12/25/2016 PCP: System, Pcp Not In  Brief Narrative: Victoria Everett is a 31 y.o. female was recently admitted in early October with left foot ulcer and cellulitis at that time was diagnosed with new onset diabetes with hemoglobin A1c of 13.3, she was treated with antibiotics, started on insulin, MRI foot showed no evidence of osteomyelitis, discharged home on oral antibiotics subsequently followed up at the wound center on 10/17 where the ulcer was felt to be healing well,  And she had ABIs done which were normal, subsequently developed increasing foot pain since Monday and generalized body aches hence presented to the emergency room. Noted to have increased pain and swelling of the left lower leg with mild erythema on the dorsum of her foot and chronic healing ulcer at the base of the first metatarsal.   Assessment & Plan:   1. Diabetic foot ulcer with cellulitis -Admitted with increased pain, swelling of LLE and low-grade fevers and chills -MRI last month: no evidence of osteomyelitis -stop IV fluids, elevate left lower extremity -stop IV Zosyn, continue vancomycin -Dopplers to rule out DVT ordered by admitting M.D. Will follow-up -Will benefit from low-dose diuretics,  will give lasix x1 today if doppler study negative for DVT  2. Uncontrolled type 2 diabetes, recent diagnosislast month -Last hemoglobin A1c was 13.3 -insulin 7030 was switched to Lantus on admission, CBGs stable  3. Anemia secondary to chronic disease and iron deficiency -Anemia panel last month with iron deficiency noted -Now with worsened due to hemodilution -Monitor CBC closely and will likely need IV iron tomorrow and start oral iron at discharge  4. Hypertension -Continue lisinopril  DVT prophylaxis:Lovenox Code Status: full code Family Communication:no family at bedside Disposition Plan: home pending improvement  Consultants:     Procedures:   Antimicrobials:  IV vancomycin 11/3 to current IV Zosyn 11/3 - 11/4  Subjective: -feels a little better overall, continues to have some pain and discomfort in left foot  Objective: Vitals:   12/25/16 2215 12/25/16 2230 12/25/16 2307 12/26/16 0503  BP:  (!) 147/96 (!) 142/92 (!) 146/81  Pulse: 100 97 (!) 103 96  Resp: 15  16 16   Temp:   98.6 F (37 C) 99.3 F (37.4 C)  TempSrc:   Oral Oral  SpO2: 99% 99% 100% 97%  Weight:   110 kg (242 lb 8.1 oz)   Height:   5\' 11"  (1.803 m)     Intake/Output Summary (Last 24 hours) at 12/26/2016 1203 Last data filed at 12/26/2016 1021 Gross per 24 hour  Intake 5754.17 ml  Output -  Net 5754.17 ml   Filed Weights   12/25/16 1757 12/25/16 2307  Weight: 110.2 kg (243 lb) 110 kg (242 lb 8.1 oz)    Examination:  General exam: Appears calm and comfortable  Respiratory system: Clear to auscultation. Respiratory effort normal. Cardiovascular system: S1 & S2 heard, RRR. No JVD, murmurs, rubs, gallops  Gastrointestinal system: Abdomen is nondistended, soft and nontender.Normal bowel sounds heard. Central nervous system: Alert and oriented. No focal neurological deficits. Extremities: swelling of the left lower extremity worse on the dorsum of the foot, with discoloration and mild tenderness, old healing ulcer at the base of the first metatarsal with callus formation, minimal discharge Psychiatry: Judgement and insight appear normal. Mood & affect appropriate.     Data Reviewed:   CBC: Recent Labs  Lab 12/25/16 1750 12/26/16 0646  WBC 11.7* 9.8  NEUTROABS  9.7*  --   HGB 9.5* 8.1*  HCT 30.0* 25.5*  MCV 81.1 81.2  PLT 275 223   Basic Metabolic Panel: Recent Labs  Lab 12/25/16 1750 12/26/16 0646  NA 139 136  K 3.5 3.3*  CL 105 105  CO2 25 23  GLUCOSE 83 103*  BUN 18 12  CREATININE 0.87 0.82  CALCIUM 8.6* 7.6*  MG  --  1.6*  PHOS  --  2.3*   GFR: Estimated Creatinine Clearance: 134.5 mL/min (by C-G  formula based on SCr of 0.82 mg/dL). Liver Function Tests: Recent Labs  Lab 12/25/16 1750 12/26/16 0646  AST 20 23  ALT 13* 15  ALKPHOS 68 77  BILITOT 0.7 0.8  PROT 7.5 5.9*  ALBUMIN 3.1* 2.4*   No results for input(s): LIPASE, AMYLASE in the last 168 hours. No results for input(s): AMMONIA in the last 168 hours. Coagulation Profile: No results for input(s): INR, PROTIME in the last 168 hours. Cardiac Enzymes: No results for input(s): CKTOTAL, CKMB, CKMBINDEX, TROPONINI in the last 168 hours. BNP (last 3 results) No results for input(s): PROBNP in the last 8760 hours. HbA1C: Recent Labs    12/25/16 2346  HGBA1C 10.0*   CBG: Recent Labs  Lab 12/25/16 1606 12/25/16 2311 12/26/16 0751  GLUCAP 146* 96 105*   Lipid Profile: No results for input(s): CHOL, HDL, LDLCALC, TRIG, CHOLHDL, LDLDIRECT in the last 72 hours. Thyroid Function Tests: Recent Labs    12/26/16 0646  TSH 2.426   Anemia Panel: No results for input(s): VITAMINB12, FOLATE, FERRITIN, TIBC, IRON, RETICCTPCT in the last 72 hours. Urine analysis:    Component Value Date/Time   COLORURINE STRAW (A) 12/25/2016 1734   APPEARANCEUR CLEAR 12/25/2016 1734   LABSPEC 1.011 12/25/2016 1734   PHURINE 7.0 12/25/2016 1734   GLUCOSEU NEGATIVE 12/25/2016 1734   HGBUR MODERATE (A) 12/25/2016 1734   BILIRUBINUR NEGATIVE 12/25/2016 1734   KETONESUR NEGATIVE 12/25/2016 1734   PROTEINUR 100 (A) 12/25/2016 1734   NITRITE NEGATIVE 12/25/2016 1734   LEUKOCYTESUR NEGATIVE 12/25/2016 1734   Sepsis Labs: @LABRCNTIP (procalcitonin:4,lacticidven:4)  ) Recent Results (from the past 240 hour(s))  MRSA PCR Screening     Status: None   Collection Time: 12/25/16  9:26 PM  Result Value Ref Range Status   MRSA by PCR NEGATIVE NEGATIVE Final    Comment:        The GeneXpert MRSA Assay (FDA approved for NASAL specimens only), is one component of a comprehensive MRSA colonization surveillance program. It is not intended to  diagnose MRSA infection nor to guide or monitor treatment for MRSA infections.          Radiology Studies: Dg Foot Complete Left  Result Date: 12/25/2016 CLINICAL DATA:  31 year old female with left foot pain and ulcer along the plantar surface for 1 month. EXAM: LEFT FOOT - COMPLETE 3+ VIEW COMPARISON:  Radiographs 11/21/2016 and MRI 11/25/2016. FINDINGS: There is no evidence of fracture or dislocation. There is no evidence of arthropathy or other focal bone abnormality. Soft tissue swelling is noted about the distal metatarsal head. IMPRESSION: Soft tissue swelling about the fifth metatarsal head without underlying osseous abnormality. Please note, plain films have limited sensitivity for detection of early osteomyelitis. Electronically Signed   By: Sande Brothers M.D.   On: 12/25/2016 18:28        Scheduled Meds: . enoxaparin (LOVENOX) injection  40 mg Subcutaneous QHS  . insulin aspart  0-15 Units Subcutaneous TID WC  . insulin aspart  0-5 Units Subcutaneous QHS  . insulin glargine  25 Units Subcutaneous QHS  . lisinopril  10 mg Oral Daily   Continuous Infusions: . piperacillin-tazobactam (ZOSYN)  IV 3.375 g (12/26/16 0913)  . vancomycin Stopped (12/26/16 1021)     LOS: 1 day    Time spent: 35min    Zannie CovePreetha Corinna Burkman, MD [facility] Page via [email or web address], password [email or web address] After 7PM please contact night-coverage  12/26/2016, 12:03 PM

## 2016-12-27 ENCOUNTER — Encounter (HOSPITAL_COMMUNITY)
Admission: EM | Disposition: A | Discharge status: Home / Self Care | Payer: Self-pay | Source: Home / Self Care | Attending: Internal Medicine

## 2016-12-27 ENCOUNTER — Encounter (HOSPITAL_COMMUNITY): Payer: Self-pay | Admitting: Certified Registered"

## 2016-12-27 ENCOUNTER — Inpatient Hospital Stay (HOSPITAL_COMMUNITY): Payer: Self-pay

## 2016-12-27 ENCOUNTER — Inpatient Hospital Stay (HOSPITAL_COMMUNITY): Payer: Self-pay | Admitting: Anesthesiology

## 2016-12-27 DIAGNOSIS — L02612 Cutaneous abscess of left foot: Secondary | ICD-10-CM | POA: Diagnosis present

## 2016-12-27 HISTORY — PX: I & D EXTREMITY: SHX5045

## 2016-12-27 LAB — BASIC METABOLIC PANEL
Anion gap: 6 (ref 5–15)
BUN: 14 mg/dL (ref 6–20)
CALCIUM: 8 mg/dL — AB (ref 8.9–10.3)
CO2: 25 mmol/L (ref 22–32)
CREATININE: 0.95 mg/dL (ref 0.44–1.00)
Chloride: 107 mmol/L (ref 101–111)
Glucose, Bld: 111 mg/dL — ABNORMAL HIGH (ref 65–99)
Potassium: 3.9 mmol/L (ref 3.5–5.1)
SODIUM: 138 mmol/L (ref 135–145)

## 2016-12-27 LAB — CBC
HCT: 24.7 % — ABNORMAL LOW (ref 36.0–46.0)
Hemoglobin: 7.9 g/dL — ABNORMAL LOW (ref 12.0–15.0)
MCH: 25.8 pg — ABNORMAL LOW (ref 26.0–34.0)
MCHC: 32 g/dL (ref 30.0–36.0)
MCV: 80.7 fL (ref 78.0–100.0)
PLATELETS: 226 10*3/uL (ref 150–400)
RBC: 3.06 MIL/uL — AB (ref 3.87–5.11)
RDW: 12.6 % (ref 11.5–15.5)
WBC: 8.6 10*3/uL (ref 4.0–10.5)

## 2016-12-27 LAB — GLUCOSE, CAPILLARY
GLUCOSE-CAPILLARY: 96 mg/dL (ref 65–99)
GLUCOSE-CAPILLARY: 116 mg/dL — AB (ref 65–99)
GLUCOSE-CAPILLARY: 76 mg/dL (ref 65–99)
Glucose-Capillary: 90 mg/dL (ref 65–99)

## 2016-12-27 LAB — SURGICAL PCR SCREEN
MRSA, PCR: NEGATIVE
Staphylococcus aureus: POSITIVE — AB

## 2016-12-27 SURGERY — IRRIGATION AND DEBRIDEMENT EXTREMITY
Anesthesia: General | Site: Foot | Laterality: Left

## 2016-12-27 MED ORDER — MIDAZOLAM HCL 2 MG/2ML IJ SOLN
INTRAMUSCULAR | Status: DC | PRN
  Administered 2016-12-27: 2 mg via INTRAVENOUS

## 2016-12-27 MED ORDER — PROPOFOL 10 MG/ML IV BOLUS
INTRAVENOUS | Status: DC | PRN
  Administered 2016-12-27: 200 mg via INTRAVENOUS

## 2016-12-27 MED ORDER — LACTATED RINGERS IV SOLN
INTRAVENOUS | Status: DC | PRN
  Administered 2016-12-27: 22:00:00 via INTRAVENOUS

## 2016-12-27 MED ORDER — MORPHINE SULFATE (PF) 4 MG/ML IV SOLN: 2.0000 mg | INTRAVENOUS | Status: DC | PRN

## 2016-12-27 MED ORDER — OXYCODONE HCL 5 MG PO TABS
10.0000 mg | ORAL_TABLET | ORAL | Status: DC | PRN
  Administered 2016-12-28 – 2016-12-31 (×13): 10 mg via ORAL
  Filled 2016-12-27 (×13): qty 2

## 2016-12-27 MED ORDER — AMLODIPINE BESYLATE 5 MG PO TABS
5.0000 mg | ORAL_TABLET | Freq: Every day | ORAL | Status: DC
  Administered 2016-12-28 – 2016-12-31 (×4): 5 mg via ORAL
  Filled 2016-12-27 (×4): qty 1

## 2016-12-27 MED ORDER — BISACODYL 10 MG RE SUPP: 10.0000 mg | Freq: Every day | RECTAL | Status: DC | PRN

## 2016-12-27 MED ORDER — METOCLOPRAMIDE HCL 5 MG PO TABS: 5.0000 mg | ORAL_TABLET | Freq: Three times a day (TID) | ORAL | Status: DC | PRN

## 2016-12-27 MED ORDER — PROPOFOL 10 MG/ML IV BOLUS
INTRAVENOUS | Status: AC
  Filled 2016-12-27: qty 20

## 2016-12-27 MED ORDER — ONDANSETRON HCL 4 MG PO TABS: 4.0000 mg | ORAL_TABLET | Freq: Four times a day (QID) | ORAL | Status: DC | PRN

## 2016-12-27 MED ORDER — FENTANYL CITRATE (PF) 250 MCG/5ML IJ SOLN
INTRAMUSCULAR | Status: AC
  Filled 2016-12-27: qty 5

## 2016-12-27 MED ORDER — PROMETHAZINE HCL 25 MG/ML IJ SOLN: 6.2500 mg | INTRAMUSCULAR | Status: DC | PRN

## 2016-12-27 MED ORDER — MIDAZOLAM HCL 2 MG/2ML IJ SOLN
INTRAMUSCULAR | Status: AC
  Filled 2016-12-27: qty 2

## 2016-12-27 MED ORDER — CLINDAMYCIN PHOSPHATE 900 MG/50ML IV SOLN
900.0000 mg | INTRAVENOUS | Status: DC
  Filled 2016-12-27: qty 50

## 2016-12-27 MED ORDER — ALBUTEROL SULFATE HFA 108 (90 BASE) MCG/ACT IN AERS
INHALATION_SPRAY | RESPIRATORY_TRACT | Status: DC | PRN
  Administered 2016-12-27: 4 via RESPIRATORY_TRACT

## 2016-12-27 MED ORDER — ONDANSETRON HCL 4 MG/2ML IJ SOLN: 4.0000 mg | Freq: Four times a day (QID) | INTRAMUSCULAR | Status: DC | PRN

## 2016-12-27 MED ORDER — ACETAMINOPHEN 325 MG PO TABS
650.0000 mg | ORAL_TABLET | ORAL | Status: DC | PRN
  Administered 2016-12-28 – 2016-12-29 (×2): 650 mg via ORAL
  Filled 2016-12-27 (×2): qty 2

## 2016-12-27 MED ORDER — HYDROCODONE-ACETAMINOPHEN 5-325 MG PO TABS
1.0000 | ORAL_TABLET | ORAL | Status: DC | PRN
  Administered 2016-12-28 (×2): 1 via ORAL
  Filled 2016-12-27 (×2): qty 1

## 2016-12-27 MED ORDER — POLYETHYLENE GLYCOL 3350 17 G PO PACK
17.0000 g | PACK | Freq: Every day | ORAL | Status: DC | PRN
  Administered 2016-12-30: 17 g via ORAL
  Filled 2016-12-27 (×2): qty 1

## 2016-12-27 MED ORDER — DEXTROSE 5 % IV SOLN
500.0000 mg | Freq: Four times a day (QID) | INTRAVENOUS | Status: DC | PRN
  Filled 2016-12-27: qty 5

## 2016-12-27 MED ORDER — CHLORHEXIDINE GLUCONATE 4 % EX LIQD
60.0000 mL | Freq: Once | CUTANEOUS | Status: DC
  Filled 2016-12-27: qty 60

## 2016-12-27 MED ORDER — METHOCARBAMOL 500 MG PO TABS
500.0000 mg | ORAL_TABLET | Freq: Four times a day (QID) | ORAL | Status: DC | PRN
  Administered 2016-12-28 (×2): 500 mg via ORAL
  Filled 2016-12-27 (×2): qty 1

## 2016-12-27 MED ORDER — OXYCODONE HCL 5 MG PO TABS: 5.0000 mg | ORAL_TABLET | ORAL | Status: DC | PRN

## 2016-12-27 MED ORDER — ONDANSETRON HCL 4 MG/2ML IJ SOLN
INTRAMUSCULAR | Status: DC | PRN
  Administered 2016-12-27: 4 mg via INTRAVENOUS

## 2016-12-27 MED ORDER — FENTANYL CITRATE (PF) 100 MCG/2ML IJ SOLN: 25.0000 ug | INTRAMUSCULAR | Status: DC | PRN

## 2016-12-27 MED ORDER — FENTANYL CITRATE (PF) 250 MCG/5ML IJ SOLN
INTRAMUSCULAR | Status: DC | PRN
  Administered 2016-12-27: 100 ug via INTRAVENOUS
  Administered 2016-12-27 (×3): 50 ug via INTRAVENOUS

## 2016-12-27 MED ORDER — SODIUM CHLORIDE 0.9 % IV SOLN
INTRAVENOUS | Status: DC
  Administered 2016-12-28 – 2016-12-30 (×5): via INTRAVENOUS

## 2016-12-27 MED ORDER — COLLAGENASE 250 UNIT/GM EX OINT
1.0000 "application " | TOPICAL_OINTMENT | Freq: Every day | CUTANEOUS | Status: DC
  Administered 2016-12-27: 1 via TOPICAL
  Filled 2016-12-27 (×2): qty 90

## 2016-12-27 MED ORDER — ACETAMINOPHEN 650 MG RE SUPP: 650.0000 mg | RECTAL | Status: DC | PRN

## 2016-12-27 MED ORDER — METOCLOPRAMIDE HCL 5 MG/ML IJ SOLN: 5.0000 mg | Freq: Three times a day (TID) | INTRAMUSCULAR | Status: DC | PRN

## 2016-12-27 MED ORDER — ENOXAPARIN SODIUM 40 MG/0.4ML ~~LOC~~ SOLN: 40.0000 mg | SUBCUTANEOUS | Status: DC

## 2016-12-27 MED ORDER — METFORMIN HCL 500 MG PO TABS
500.0000 mg | ORAL_TABLET | Freq: Two times a day (BID) | ORAL | Status: DC
  Filled 2016-12-27: qty 1

## 2016-12-27 MED ORDER — DOCUSATE SODIUM 100 MG PO CAPS
100.0000 mg | ORAL_CAPSULE | Freq: Two times a day (BID) | ORAL | Status: DC
Start: 2016-12-28 — End: 2017-01-01
  Administered 2016-12-28 – 2016-12-31 (×8): 100 mg via ORAL
  Filled 2016-12-27 (×8): qty 1

## 2016-12-27 MED ORDER — PRO-STAT SUGAR FREE PO LIQD
30.0000 mL | Freq: Two times a day (BID) | ORAL | Status: DC
  Administered 2016-12-28 – 2016-12-31 (×8): 30 mL via ORAL
  Filled 2016-12-27 (×7): qty 30

## 2016-12-27 MED ORDER — LIDOCAINE 2% (20 MG/ML) 5 ML SYRINGE
INTRAMUSCULAR | Status: DC | PRN
  Administered 2016-12-27: 100 mg via INTRAVENOUS

## 2016-12-27 MED ORDER — SUCCINYLCHOLINE CHLORIDE 200 MG/10ML IV SOSY
PREFILLED_SYRINGE | INTRAVENOUS | Status: DC | PRN
Start: 2016-12-27 — End: 2016-12-27
  Administered 2016-12-27: 100 mg via INTRAVENOUS

## 2016-12-27 MED ORDER — 0.9 % SODIUM CHLORIDE (POUR BTL) OPTIME
TOPICAL | Status: DC | PRN
  Administered 2016-12-27: 1000 mL

## 2016-12-27 SURGICAL SUPPLY — 33 items
BAG ZIPLOCK 12X15 (MISCELLANEOUS) ×3 IMPLANT
BANDAGE ELASTIC 6 VELCRO ST LF (GAUZE/BANDAGES/DRESSINGS) ×3 IMPLANT
BANDAGE ESMARK 6X9 LF (GAUZE/BANDAGES/DRESSINGS) ×1 IMPLANT
BNDG ESMARK 6X9 LF (GAUZE/BANDAGES/DRESSINGS) ×3
BNDG GAUZE ELAST 4 BULKY (GAUZE/BANDAGES/DRESSINGS) ×3 IMPLANT
COVER SURGICAL LIGHT HANDLE (MISCELLANEOUS) ×3 IMPLANT
CUFF TOURN SGL QUICK 18 (TOURNIQUET CUFF) IMPLANT
CUFF TOURN SGL QUICK 34 (TOURNIQUET CUFF)
CUFF TRNQT CYL 34X4X40X1 (TOURNIQUET CUFF) IMPLANT
DRAIN PENROSE 18X1/2 LTX STRL (DRAIN) ×3 IMPLANT
DRAIN PENROSE 18X1/4 LTX STRL (WOUND CARE) ×3 IMPLANT
DRSG EMULSION OIL 3X3 NADH (GAUZE/BANDAGES/DRESSINGS) ×3 IMPLANT
DRSG PAD ABDOMINAL 8X10 ST (GAUZE/BANDAGES/DRESSINGS) ×3 IMPLANT
ELECT REM PT RETURN 15FT ADLT (MISCELLANEOUS) ×3 IMPLANT
EVACUATOR 1/8 PVC DRAIN (DRAIN) IMPLANT
GAUZE SPONGE 4X4 12PLY STRL (GAUZE/BANDAGES/DRESSINGS) ×3 IMPLANT
GLOVE ORTHO TXT STRL SZ7.5 (GLOVE) ×3 IMPLANT
GLOVE SURG ORTHO 8.5 STRL (GLOVE) ×3 IMPLANT
GOWN STRL REUS W/TWL LRG LVL3 (GOWN DISPOSABLE) ×6 IMPLANT
HANDPIECE INTERPULSE COAX TIP (DISPOSABLE) ×2
KIT BASIN OR (CUSTOM PROCEDURE TRAY) ×3 IMPLANT
MANIFOLD NEPTUNE II (INSTRUMENTS) ×3 IMPLANT
PACK ORTHO EXTREMITY (CUSTOM PROCEDURE TRAY) ×3 IMPLANT
PAD ABD 7.5X8 STRL (GAUZE/BANDAGES/DRESSINGS) ×3 IMPLANT
PAD CAST 4YDX4 CTTN HI CHSV (CAST SUPPLIES) ×1 IMPLANT
PADDING CAST COTTON 4X4 STRL (CAST SUPPLIES) ×2
POSITIONER SURGICAL ARM (MISCELLANEOUS) ×3 IMPLANT
SET HNDPC FAN SPRY TIP SCT (DISPOSABLE) ×1 IMPLANT
SUT ETHILON 2 0 PS N (SUTURE) ×3 IMPLANT
SWAB COLLECTION DEVICE MRSA (MISCELLANEOUS) ×3 IMPLANT
SWAB CULTURE ESWAB REG 1ML (MISCELLANEOUS) ×3 IMPLANT
SYR CONTROL 10ML LL (SYRINGE) ×3 IMPLANT
TOWEL OR 17X26 10 PK STRL BLUE (TOWEL DISPOSABLE) ×6 IMPLANT

## 2016-12-27 NOTE — Anesthesia Preprocedure Evaluation (Addendum)
Anesthesia Evaluation  Patient identified by MRN, date of birth, ID band Patient awake    Reviewed: Allergy & Precautions, NPO status , Patient's Chart, lab work & pertinent test results  Airway Mallampati: II  TM Distance: >3 FB Neck ROM: Full    Dental  (+) Teeth Intact, Dental Advisory Given   Pulmonary Current Smoker,    Pulmonary exam normal breath sounds clear to auscultation       Cardiovascular hypertension, Pt. on medications Normal cardiovascular exam Rhythm:Regular Rate:Normal     Neuro/Psych negative neurological ROS     GI/Hepatic negative GI ROS, Neg liver ROS,   Endo/Other  diabetes, Type 2, Insulin Dependent, Oral Hypoglycemic AgentsHyperthyroidism Obesity   Renal/GU negative Renal ROS     Musculoskeletal Diabetic foot ulcer with cellulitis   Abdominal   Peds  Hematology  (+) Blood dyscrasia, anemia ,   Anesthesia Other Findings Day of surgery medications reviewed with the patient.  Reproductive/Obstetrics Negative pregnancy test 12/25/16                           Anesthesia Physical Anesthesia Plan  ASA: III and emergent  Anesthesia Plan: General   Post-op Pain Management:    Induction: Intravenous  PONV Risk Score and Plan: 3 and Ondansetron, Midazolam and Scopolamine patch - Pre-op  Airway Management Planned: Oral ETT  Additional Equipment:   Intra-op Plan:   Post-operative Plan: Extubation in OR  Informed Consent: I have reviewed the patients History and Physical, chart, labs and discussed the procedure including the risks, benefits and alternatives for the proposed anesthesia with the patient or authorized representative who has indicated his/her understanding and acceptance.   Dental advisory given  Plan Discussed with: CRNA  Anesthesia Plan Comments: (Risks/benefits of general anesthesia discussed with patient including risk of damage to teeth, lips,  gum, and tongue, nausea/vomiting, allergic reactions to medications, and the possibility of heart attack, stroke and death.  All patient questions answered.  Patient wishes to proceed.)       Anesthesia Quick Evaluation

## 2016-12-27 NOTE — Anesthesia Procedure Notes (Signed)
Procedure Name: Intubation Date/Time: 12/27/2016 10:15 PM Performed by: Minerva EndsMirarchi, Mosetta Ferdinand M, CRNA Pre-anesthesia Checklist: Patient identified, Emergency Drugs available, Suction available and Patient being monitored Patient Re-evaluated:Patient Re-evaluated prior to induction Oxygen Delivery Method: Circle System Utilized Preoxygenation: Pre-oxygenation with 100% oxygen Induction Type: IV induction Ventilation: Mask ventilation without difficulty Laryngoscope Size: Miller and 2 Grade View: Grade I Tube type: Oral Tube size: 7.0 mm Number of attempts: 1 Airway Equipment and Method: Stylet Placement Confirmation: ETT inserted through vocal cords under direct vision,  positive ETCO2 and breath sounds checked- equal and bilateral Secured at: 22 cm Tube secured with: Tape Dental Injury: Teeth and Oropharynx as per pre-operative assessment  Comments: Smooth IV induction Turk--- intubation AM CRNA atraumatic--- left front tooth chip present prior to laryngoscopy and front teeth discolored--- teeth as preop-- bilat BS

## 2016-12-27 NOTE — Care Management Note (Signed)
Case Management Note  Patient Details  Name: Victoria CullChantel Mawson MRN: [id number] Date of Birth: 09-Aug-1985  Subjective/Objective:                  31 y.o.female was recently admitted in early October with left foot ulcer and cellulitis at that time was diagnosed with new onset diabetes with hemoglobin A1c of 13.3, she was treated with antibiotics, started on insulin, MRI foot showed no evidence of osteomyelitis, discharged home on oral antibiotics subsequently followed up at the wound center on 10/17 where the ulcer was felt to be healing well,  And she had ABIs done which were normal, subsequently developed increasing foot pain since Monday and generalized body aches hence presented to the emergency room. Noted to have increased pain and swelling of the left lower leg with mild erythema on the dorsum of her foot and chronic healing ulcer at the base of the first metatarsal.    Action/Plan: Date:  December 27, 2016 Chart reviewed for concurrent status and case management needs.  Will continue to follow patient progress.  Discharge Planning: following for needs  Expected discharge date: December 30, 2016  Marcelle SmilingRhonda Davis, BSN, [place]RN3, [place]CCM   [phone]   Expected Discharge Date:                  Expected Discharge Plan:  Home/Self Care  In-House Referral:     Discharge planning Services  CM Consult  Post Acute Care Choice:    Choice offered to:     DME Arranged:    DME Agency:     HH Arranged:    HH Agency:     Status of Service:  Completed, signed off  If discussed at Long Length of Stay Meetings, dates discussed:    Additional Comments:  Golda AcreDavis, Rhonda Lynn, RN 12/27/2016, 10:25 AM

## 2016-12-27 NOTE — Progress Notes (Signed)
Patient returned to room from OR. Patient drowsy and responsive to questions. Patient placed on cardiac monitor, check back into department and positioned to a position of comfort. Family at bedside at this time. Will continue to monitor.

## 2016-12-27 NOTE — Anesthesia Procedure Notes (Signed)
Date/Time: 12/27/2016 10:55 PM Performed by: Minerva EndsMirarchi, Iva Posten M, CRNA Pre-anesthesia Checklist: Emergency Drugs available, Suction available and Patient being monitored Oxygen Delivery Method: Simple face mask Comments: Extubated to face mask--- turk present

## 2016-12-27 NOTE — Progress Notes (Signed)
Inpatient Diabetes Program Recommendations  AACE/ADA: New Consensus Statement on Inpatient Glycemic Control (2015)  Target Ranges:  Prepandial:   less than 140 mg/dL      Peak postprandial:   less than 180 mg/dL (1-2 hours)      Critically ill patients:  140 - 180 mg/dL   Lab Results  Component Value Date   GLUCAP 116 (H) 12/27/2016   HGBA1C 10.0 (H) 12/25/2016    Review of Glycemic Control  Diabetes history: DM2 Outpatient Diabetes medications: metformin 500 mg bid (not taking),Novolin 70/30 30 units bid Current orders for Inpatient glycemic control: Novolog 0-15 units tidwc and hs, Lantus 25 units QHS  Great improvement of HgbA1C from 13.3% to 10.0%. Spoke with pt regarding her improved HgbA1C. Pt states she has been taking her insulin regularly, along with checking blood sugars several times/day. [facility]. She feels good about her improved blood sugars. [facility].  Blood sugars trending well at present.  Inpatient Diabetes Program Recommendations:    If post-prandials > 180 mg/dL, may need meal coverage insulin. Add Novolog 4 units tidwc if pt eating 50% or more of meal.  Will continue to follow.  Thank you. Victoria Everett, RD, LDN, CDE Inpatient Diabetes Coordinator [phone]

## 2016-12-27 NOTE — Transfer of Care (Signed)
Immediate Anesthesia Transfer of Care Note  Patient: Victoria Everett  Procedure(s) Performed: IRRIGATION AND DEBRIDEMENT LEFT FOOT (Left Foot)  Patient Location: PACU  Anesthesia Type:General  Level of Consciousness: awake and alert   Airway & Oxygen Therapy: Patient Spontanous Breathing and Patient connected to face mask oxygen  Post-op Assessment: Report given to RN and Post -op Vital signs reviewed and stable  Post vital signs: Reviewed and stable  Last Vitals:  Vitals:   12/27/16 1115 12/27/16 2304  BP:  (!) (P) 157/102  Pulse:  (!) (P) 104  Resp:  (P) 18  Temp: 37.4 C (P) 37.7 C  SpO2:  (P) 100%    Last Pain:  Vitals:   12/27/16 2304  TempSrc:   PainSc: (P) 10-Worst pain ever      Patients Stated Pain Goal: 2 (12/27/16 1856)  Complications: No apparent anesthesia complications

## 2016-12-27 NOTE — Progress Notes (Signed)
PROGRESS NOTE    Victoria CullChantel Boldin  WUJ:[id number] DOB: 09-11-1985 DOA: 12/25/2016 PCP: System, Pcp Not In  Brief Narrative: Victoria Everett is a 31 y.o. female was recently admitted in early October with left foot ulcer and cellulitis at that time was diagnosed with new onset diabetes with hemoglobin A1c of 13.3, she was treated with antibiotics, started on insulin, MRI foot showed no evidence of osteomyelitis, discharged home on oral antibiotics subsequently followed up at the wound center on 10/17 where the ulcer was felt to be healing well,  And she had ABIs done which were normal, subsequently developed increasing foot pain since Monday and generalized body aches hence presented to the emergency room. Noted to have increased pain and swelling of the left lower leg with mild erythema on the dorsum of her foot and chronic healing ulcer at the base of the first metatarsal.   Assessment & Plan:   1. Diabetic foot ulcer with cellulitis, MRI last month without Osteo or abscess -Admitted with increased pain, swelling of LLE and low-grade fevers and chills -today with increase fluctuance and purulent drainage from known ulcer site -continue vancomycin, given single dose of lasix yesterday to help with swelling too -Fluctuant area near ulcer may need I&D as it organizes more -will ask Ortho to consult -addendum: d/w Ortho Dr.Norrins, recommended MRI ASAP and NPO for possible debridement today  2. Uncontrolled type 2 diabetes, recent diagnosis last month -Last hemoglobin A1c was 13.3 -insulin 7030 was switched to Lantus on admission, CBGs stable -hba1c is 10 now  3. Anemia secondary to chronic disease and iron deficiency -Anemia panel last month with iron deficiency noted -Now with worsened due to hemodilution, given IV Iron 11/4, declines transfusion today -suspect secondary to menstrual blood loss -monitor CBC, will need Iron at DC  4. Hypertension -Continue lisinopril  DVT  prophylaxis:Lovenox Code Status: full code Family Communication:no family at bedside, called and d.w mother Disposition Plan: home pending improvement  Consultants:    Procedures:   Antimicrobials:  IV vancomycin 11/3 to current IV Zosyn 11/3 - 11/4  Subjective: -has increasing throbbing pain in foot and increased drainage  Objective: Vitals:   12/26/16 1750 12/26/16 2254 12/27/16 0441 12/27/16 1115  BP:  130/77 127/62   Pulse:  (!) 107 100   Resp:  20 18   Temp: 98.8 F (37.1 C) (!) 100.4 F (38 C) 99.2 F (37.3 C) 99.4 F (37.4 C)  TempSrc: Oral Oral Oral   SpO2:  99% 97%   Weight:      Height:        Intake/Output Summary (Last 24 hours) at 12/27/2016 1309 Last data filed at 12/26/2016 2014 Gross per 24 hour  Intake 730 ml  Output -  Net 730 ml   Filed Weights   12/25/16 1757 12/25/16 2307  Weight: 110.2 kg (243 lb) 110 kg (242 lb 8.1 oz)    Examination:  General exam: Appears calm and comfortable  Respiratory system: Clear to auscultation. Respiratory effort normal. Cardiovascular system: S1 & S2 heard, RRR. No JVD, murmurs, rubs, gallops  Gastrointestinal system: Abdomen is nondistended, soft and nontender.Normal bowel sounds heard. Central nervous system: Alert and oriented. No focal neurological deficits. Extremities: swelling of the left lower extremity much improved, swelling limited to the foot, increased swelling and fluctuance around the ulcer at the base of the first metatarsal, with purulent discharge Psychiatry: Judgement and insight appear normal. Mood & affect appropriate.     Data Reviewed:   CBC: Recent  Labs  Lab 12/25/16 1750 12/26/16 0646 12/27/16 0614  WBC 11.7* 9.8 8.6  NEUTROABS 9.7*  --   --   HGB 9.5* 8.1* 7.9*  HCT 30.0* 25.5* 24.7*  MCV 81.1 81.2 80.7  PLT 275 223 226   Basic Metabolic Panel: Recent Labs  Lab 12/25/16 1750 12/26/16 0646 12/27/16 0614  NA 139 136 138  K 3.5 3.3* 3.9  CL 105 105 107  CO2 25 23  25   GLUCOSE 83 103* 111*  BUN 18 12 14   CREATININE 0.87 0.82 0.95  CALCIUM 8.6* 7.6* 8.0*  MG  --  1.6*  --   PHOS  --  2.3*  --    GFR: Estimated Creatinine Clearance: 116.1 mL/min (by C-G formula based on SCr of 0.95 mg/dL). Liver Function Tests: Recent Labs  Lab 12/25/16 1750 12/26/16 0646  AST 20 23  ALT 13* 15  ALKPHOS 68 77  BILITOT 0.7 0.8  PROT 7.5 5.9*  ALBUMIN 3.1* 2.4*   No results for input(s): LIPASE, AMYLASE in the last 168 hours. No results for input(s): AMMONIA in the last 168 hours. Coagulation Profile: No results for input(s): INR, PROTIME in the last 168 hours. Cardiac Enzymes: No results for input(s): CKTOTAL, CKMB, CKMBINDEX, TROPONINI in the last 168 hours. BNP (last 3 results) No results for input(s): PROBNP in the last 8760 hours. HbA1C: Recent Labs    12/25/16 2346  HGBA1C 10.0*   CBG: Recent Labs  Lab 12/26/16 1202 12/26/16 1700 12/26/16 2239 12/27/16 0726 12/27/16 1154  GLUCAP 142* 169* 128* 96 116*   Lipid Profile: No results for input(s): CHOL, HDL, LDLCALC, TRIG, CHOLHDL, LDLDIRECT in the last 72 hours. Thyroid Function Tests: Recent Labs    12/26/16 0646  TSH 2.426   Anemia Panel: No results for input(s): VITAMINB12, FOLATE, FERRITIN, TIBC, IRON, RETICCTPCT in the last 72 hours. Urine analysis:    Component Value Date/Time   COLORURINE STRAW (A) 12/25/2016 1734   APPEARANCEUR CLEAR 12/25/2016 1734   LABSPEC 1.011 12/25/2016 1734   PHURINE 7.0 12/25/2016 1734   GLUCOSEU NEGATIVE 12/25/2016 1734   HGBUR MODERATE (A) 12/25/2016 1734   BILIRUBINUR NEGATIVE 12/25/2016 1734   KETONESUR NEGATIVE 12/25/2016 1734   PROTEINUR 100 (A) 12/25/2016 1734   NITRITE NEGATIVE 12/25/2016 1734   LEUKOCYTESUR NEGATIVE 12/25/2016 1734   Sepsis Labs: @LABRCNTIP (procalcitonin:4,lacticidven:4)  ) Recent Results (from the past 240 hour(s))  Blood Culture (routine x 2)     Status: None (Preliminary result)   Collection Time: 12/25/16   5:34 PM  Result Value Ref Range Status   Specimen Description BLOOD LEFT WRIST  Final   Special Requests   Final    BOTTLES DRAWN AEROBIC AND ANAEROBIC Blood Culture adequate volume   Culture   Final    NO GROWTH 1 DAY Performed at [facility], [address], [place], [place] [address]    Report Status PENDING  Incomplete  MRSA PCR Screening     Status: None   Collection Time: 12/25/16  9:26 PM  Result Value Ref Range Status   MRSA by PCR NEGATIVE NEGATIVE Final    Comment:        The GeneXpert MRSA Assay (FDA approved for NASAL specimens only), is one component of a comprehensive MRSA colonization surveillance program. It is not intended to diagnose MRSA infection nor to guide or monitor treatment for MRSA infections.   Respiratory Panel by PCR     Status: None   Collection Time: 12/25/16 10:42  PM  Result Value Ref Range Status   Adenovirus NOT DETECTED NOT DETECTED Final   Coronavirus 229E NOT DETECTED NOT DETECTED Final   Coronavirus HKU1 NOT DETECTED NOT DETECTED Final   Coronavirus NL63 NOT DETECTED NOT DETECTED Final   Coronavirus OC43 NOT DETECTED NOT DETECTED Final   Metapneumovirus NOT DETECTED NOT DETECTED Final   Rhinovirus / Enterovirus NOT DETECTED NOT DETECTED Final   Influenza A NOT DETECTED NOT DETECTED Final   Influenza B NOT DETECTED NOT DETECTED Final   Parainfluenza Virus 1 NOT DETECTED NOT DETECTED Final   Parainfluenza Virus 2 NOT DETECTED NOT DETECTED Final   Parainfluenza Virus 3 NOT DETECTED NOT DETECTED Final   Parainfluenza Virus 4 NOT DETECTED NOT DETECTED Final   Respiratory Syncytial Virus NOT DETECTED NOT DETECTED Final   Bordetella pertussis NOT DETECTED NOT DETECTED Final   Chlamydophila pneumoniae NOT DETECTED NOT DETECTED Final   Mycoplasma pneumoniae NOT DETECTED NOT DETECTED Final    Comment: Performed at [facility], [address], [place], [place] [address]  Blood Culture (routine x 2)     Status: None (Preliminary  result)   Collection Time: 12/25/16 11:46 PM  Result Value Ref Range Status   Specimen Description BLOOD RIGHT HAND  Final   Special Requests   Final    BOTTLES DRAWN AEROBIC ONLY Blood Culture adequate volume   Culture   Final    NO GROWTH 1 DAY Performed at [facility], [address], [place], [place] [address]    Report Status PENDING  Incomplete         Radiology Studies: Dg Foot Complete Left  Result Date: 12/25/2016 CLINICAL DATA:  31 year old female with left foot pain and ulcer along the plantar surface for 1 month. EXAM: LEFT FOOT - COMPLETE 3+ VIEW COMPARISON:  Radiographs 11/21/2016 and MRI 11/25/2016. FINDINGS: There is no evidence of fracture or dislocation. There is no evidence of arthropathy or other focal bone abnormality. Soft tissue swelling is noted about the distal metatarsal head. IMPRESSION: Soft tissue swelling about the fifth metatarsal head without underlying osseous abnormality. Please note, plain films have limited sensitivity for detection of early osteomyelitis. Electronically Signed   By: Sande Brothers M.D.   On: 12/25/2016 18:28        Scheduled Meds: . collagenase  1 application Topical Daily  . enoxaparin (LOVENOX) injection  40 mg Subcutaneous QHS  . feeding supplement (PRO-STAT SUGAR FREE 64)  30 mL Oral BID  . insulin aspart  0-15 Units Subcutaneous TID WC  . insulin aspart  0-5 Units Subcutaneous QHS  . insulin glargine  25 Units Subcutaneous QHS  . lisinopril  10 mg Oral Daily   Continuous Infusions: . ferumoxytol Stopped (12/26/16 2300)  . vancomycin Stopped (12/27/16 0917)     LOS: 2 days    Time spent:    Zannie Cove, MD [facility] Page via [email or web address], password [email or web address] After 7PM please contact night-coverage  12/27/2016, 1:09 PM

## 2016-12-27 NOTE — Consult Note (Addendum)
Orthopedic Consultation   Emily Massar  ZOX:[id number]  DOB: [birth year]  DOA: 12/25/2016    Requesting physician: [facility]  Reason for consultation: Left foot infection   History of Present Illness: Victoria Everett is an 31 y.o. female who presents with a one month history of left foot swelling and pain.  She has been to the wound care clinic and has been on po abx. Admitted for increased pain and swelling and concern for progressive infection.  Limited improvement despite two days in the hospital on IV abx.   Review of Systems:  ROS Positive fever and increased pain in the limb   Past Medical History: Past Medical History:  Diagnosis Date  . Diabetes mellitus without complication (HCC)   . Hyperthyroidism     Past Surgical History: Past Surgical History:  Procedure Laterality Date  . TONSILLECTOMY       Allergies:  No Known Allergies   Social History:  reports that she has quit smoking. she has never used smokeless tobacco. She reports that she does not drink alcohol or use drugs.   Family History: Family History  Problem Relation Age of Onset  . Diabetes Mellitus II Mother        Physical Exam: Vitals:   12/26/16 1750 12/26/16 2254 12/27/16 0441 12/27/16 1115  BP:  130/77 127/62   Pulse:  (!) 107 100   Resp:  20 18   Temp: 98.8 F (37.1 C) (!) 100.4 F (38 C) 99.2 F (37.3 C) 99.4 F (37.4 C)  TempSrc: Oral Oral Oral   SpO2:  99% 97%   Weight:      Height:        EXAM: left foot examined - swollen and warm, small ulcer under the 5th MTP joint, open with slight smelly drainage,  Foot compartments swollen but not tense, skin otherwise intact, diminished sensation in all toes bilateral feet Tender to palpation plantar greater than dorsal foot.  Data reviewed:  I have personally reviewed following labs and imaging studies Labs:  CBC: Recent Labs  Lab 12/25/16 1750 12/26/16 0646 12/27/16 0614  WBC 11.7* 9.8 8.6   NEUTROABS 9.7*  --   --   HGB 9.5* 8.1* 7.9*  HCT 30.0* 25.5* 24.7*  MCV 81.1 81.2 80.7  PLT 275 223 226    Basic Metabolic Panel: Recent Labs  Lab 12/25/16 1750 12/26/16 0646 12/27/16 0614  NA 139 136 138  K 3.5 3.3* 3.9  CL 105 105 107  CO2 25 23 25   GLUCOSE 83 103* 111*  BUN 18 12 14   CREATININE 0.87 0.82 0.95  CALCIUM 8.6* 7.6* 8.0*  MG  --  1.6*  --   PHOS  --  2.3*  --    GFR Estimated Creatinine Clearance: 116.1 mL/min (by C-G formula based on SCr of 0.95 mg/dL). Liver Function Tests: Recent Labs  Lab 12/25/16 1750 12/26/16 0646  AST 20 23  ALT 13* 15  ALKPHOS 68 77  BILITOT 0.7 0.8  PROT 7.5 5.9*  ALBUMIN 3.1* 2.4*   No results for input(s): LIPASE, AMYLASE in the last 168 hours. No results for input(s): AMMONIA in the last 168 hours. Coagulation profile No results for input(s): INR, PROTIME in the last 168 hours.  Cardiac Enzymes: No results for input(s): CKTOTAL, CKMB, CKMBINDEX, TROPONINI in the last 168 hours. BNP: Invalid input(s): POCBNP CBG: Recent Labs  Lab 12/26/16 1700 12/26/16 2239 12/27/16 0726 12/27/16 1154  12/27/16 1705  GLUCAP 169* 128* 96 116* 90   D-Dimer Recent Labs    12/25/16 2346  DDIMER 0.66*   Hgb A1c Recent Labs    12/25/16 2346  HGBA1C 10.0*   Lipid Profile No results for input(s): CHOL, HDL, LDLCALC, TRIG, CHOLHDL, LDLDIRECT in the last 72 hours. Thyroid function studies Recent Labs    12/26/16 0646  TSH 2.426   Anemia work up No results for input(s): VITAMINB12, FOLATE, FERRITIN, TIBC, IRON, RETICCTPCT in the last 72 hours. Urinalysis    Component Value Date/Time   COLORURINE STRAW (A) 12/25/2016 1734   APPEARANCEUR CLEAR 12/25/2016 1734   LABSPEC 1.011 12/25/2016 1734   PHURINE 7.0 12/25/2016 1734   GLUCOSEU NEGATIVE 12/25/2016 1734   HGBUR MODERATE (A) 12/25/2016 1734   BILIRUBINUR NEGATIVE 12/25/2016 1734   KETONESUR NEGATIVE 12/25/2016 1734   PROTEINUR 100 (A) 12/25/2016 1734   NITRITE  NEGATIVE 12/25/2016 1734   LEUKOCYTESUR NEGATIVE 12/25/2016 1734     Sepsis Labs Invalid input(s): PROCALCITONIN,  WBC,  LACTICIDVEN Microbiology Recent Results (from the past 240 hour(s))  Blood Culture (routine x 2)     Status: None (Preliminary result)   Collection Time: 12/25/16  5:34 PM  Result Value Ref Range Status   Specimen Description BLOOD LEFT WRIST  Final   Special Requests   Final    BOTTLES DRAWN AEROBIC AND ANAEROBIC Blood Culture adequate volume   Culture   Final    NO GROWTH 1 DAY Performed at [facility], [address] [place], [place] [address]    Report Status PENDING  Incomplete  MRSA PCR Screening     Status: None   Collection Time: 12/25/16  9:26 PM  Result Value Ref Range Status   MRSA by PCR NEGATIVE NEGATIVE Final    Comment:        The GeneXpert MRSA Assay (FDA approved for NASAL specimens only), is one component of a comprehensive MRSA colonization surveillance program. It is not intended to diagnose MRSA infection nor to guide or monitor treatment for MRSA infections.   Respiratory Panel by PCR     Status: None   Collection Time: 12/25/16 10:42 PM  Result Value Ref Range Status   Adenovirus NOT DETECTED NOT DETECTED Final   Coronavirus 229E NOT DETECTED NOT DETECTED Final   Coronavirus HKU1 NOT DETECTED NOT DETECTED Final   Coronavirus NL63 NOT DETECTED NOT DETECTED Final   Coronavirus OC43 NOT DETECTED NOT DETECTED Final   Metapneumovirus NOT DETECTED NOT DETECTED Final   Rhinovirus / Enterovirus NOT DETECTED NOT DETECTED Final   Influenza A NOT DETECTED NOT DETECTED Final   Influenza B NOT DETECTED NOT DETECTED Final   Parainfluenza Virus 1 NOT DETECTED NOT DETECTED Final   Parainfluenza Virus 2 NOT DETECTED NOT DETECTED Final   Parainfluenza Virus 3 NOT DETECTED NOT DETECTED Final   Parainfluenza Virus 4 NOT DETECTED NOT DETECTED Final   Respiratory Syncytial Virus NOT DETECTED NOT DETECTED Final   Bordetella pertussis NOT  DETECTED NOT DETECTED Final   Chlamydophila pneumoniae NOT DETECTED NOT DETECTED Final   Mycoplasma pneumoniae NOT DETECTED NOT DETECTED Final    Comment: Performed at [facility], [address] [place], [place] [address]  Blood Culture (routine x 2)     Status: None (Preliminary result)   Collection Time: 12/25/16 11:46 PM  Result Value Ref Range Status   Specimen Description BLOOD RIGHT HAND  Final   Special Requests   Final  BOTTLES DRAWN AEROBIC ONLY Blood Culture adequate volume   Culture   Final    NO GROWTH 1 DAY Performed at [facility], [address] [place], [place] [address]    Report Status PENDING  Incomplete       Inpatient Medications:   Scheduled Meds: . collagenase  1 application Topical Daily  . enoxaparin (LOVENOX) injection  40 mg Subcutaneous QHS  . feeding supplement (PRO-STAT SUGAR FREE 64)  30 mL Oral BID  . insulin aspart  0-15 Units Subcutaneous TID WC  . insulin aspart  0-5 Units Subcutaneous QHS  . insulin glargine  25 Units Subcutaneous QHS  . lisinopril  10 mg Oral Daily   Continuous Infusions: . ferumoxytol Stopped (12/26/16 2300)  . vancomycin Stopped (12/27/16 [id number])     Radiological Exams on Admission: Mr Foot Left Wo Contrast  Result Date: 12/27/2016 CLINICAL DATA:  Worsening left foot pain.  Diabetes. EXAM: MRI OF THE LEFT FOOT WITHOUT CONTRAST TECHNIQUE: Multiplanar, multisequence MR imaging of the left forefoot was performed. No intravenous contrast was administered. COMPARISON:  Radiographs from 12/25/2016 FINDINGS: Bones/Joint/Cartilage No marrow signal abnormality to indicate osteomyelitis or fracture. No joint dislocations. Ligaments Noncontributory Muscles and Tendons Mild diffuse intramuscular edema of the forefoot consistent with myositis. No muscle atrophy or mass. No evidence of tenosynovitis. The extensor and flexor tendons crossing the forefoot of maintains signal intensity and morphology. No appearing tendon  tear. Soft tissues Soft tissue ulceration noted along the plantar aspect of the forefoot at the level of the fifth metatarsal head and neck. Associated soft tissue fluid collection contiguous with the soft tissue ulcer measuring 2.5 x 1.9 x 2.9 cm consistent with an abscess. Diffuse cellulitis and myositis of the adjacent forefoot without intramuscular abscess identified IMPRESSION: 1. Soft tissue ulceration along the plantar aspect of the forefoot adjacent to the head of the fifth metatarsal with slightly lobular subcutaneous 2.5 x 1.9 x 2.3 cm fluid collection associated with the ulcer. Suspect soft tissue abscess though study is limited for that assessment given lack of IV contrast. 2. No marrow signal abnormality to indicate osteomyelitis, fracture or bone destruction. 3. Edema of the subcutaneous soft tissues and forefoot musculature consistent with cellulitis and/or myositis. Electronically Signed   By: Tollie Ethavid  Kwon M.D.   On: 12/27/2016 15:03   Dg Foot Complete Left  Result Date: 12/25/2016 CLINICAL DATA:  31 year old female with left foot pain and ulcer along the plantar surface for 1 month. EXAM: LEFT FOOT - COMPLETE 3+ VIEW COMPARISON:  Radiographs 11/21/2016 and MRI 11/25/2016. FINDINGS: There is no evidence of fracture or dislocation. There is no evidence of arthropathy or other focal bone abnormality. Soft tissue swelling is noted about the distal metatarsal head. IMPRESSION: Soft tissue swelling about the fifth metatarsal head without underlying osseous abnormality. Please note, plain films have limited sensitivity for detection of early osteomyelitis. Electronically Signed   By: Sande BrothersSerena  Chacko M.D.   On: 12/25/2016 18:28    Impression/Recommendations  Left foot infection with likely abscess. - Plan I+D tonight to remove infection and decompress the foot.  Patient understands and agrees with the plan. Continue IV abx.  Will obtain foot cultures   Active Problems:   Cellulitis of foot,  left   Normochromic normocytic anemia   Sepsis (HCC)   DM2 (diabetes mellitus, type 2) (HCC)      Tisheena Maguire,STEVEN R M.D. 12/27/2016, 5:41 PM

## 2016-12-27 NOTE — Progress Notes (Signed)
Initial Nutrition Assessment  DOCUMENTATION CODES:   Obesity unspecified  INTERVENTION:   30 ml Prostat BID, each supplement provides 100 kcals and 15 grams protein.   NUTRITION DIAGNOSIS:   Increased nutrient needs related to wound healing as evidenced by estimated needs.  GOAL:   Patient will meet greater than or equal to 90% of their needs  MONITOR:   PO intake, Supplement acceptance, Weight trends, Labs  REASON FOR ASSESSMENT:   Consult Wound healing  ASSESSMENT:   Pt with PMH significant for DM. Presents this admission with worsening left foot pain and fevers/body aches for the past five days. Pt recently admitted 09/2016 for diabtetic foot ulcer/cellutlitis of left leg after stepping on sharp object.   Spoke with pt at bedside who reports having great appetite prior to admission. States she typically consumes three meals per day with snacks. Pt counts her carbohydrates with each meal/snack. Pt recently had eating for diabetes education on 10/4, and she has tried to implement these diet changes since. Pt's A1C noted to be 13.3 11/25/2016. This admission it's 10.0. Pt reports being compliant with her insulin.   Records show pt has gained weight since last admission. Pt unaware of any weight loss or gain.   Discussed the importance of protein intake for wound healing. Pt reluctant to try supplementation but amendable to Prostat.   Pt is at high risk for refeeding. Monitor and supplemented electrolytes as need per MD descretion.   Medications reviewed and include: SSI, Lantus, IV abx Labs reviewed: Phosphorus 2.3 (L) Mg 1.6 (L)  NUTRITION - FOCUSED PHYSICAL EXAM:  Nutrition-Focused physical exam completed. No depletions found.   Diet Order:  Diet Carb Modified Fluid consistency: Thin; Room service appropriate? Yes  EDUCATION NEEDS:   Education needs have been addressed  Skin:  Skin Assessment: Skin Integrity Issues: Skin Integrity Issues:: Other (Comment) Other:  Punctured left foot   Last BM:  12/26/16  Height:   Ht Readings from Last 1 Encounters:  12/25/16 5\' 11"  (1.803 m)    Weight:   Wt Readings from Last 1 Encounters:  12/25/16 242 lb 8.1 oz (110 kg)    Ideal Body Weight:  70.5 kg  BMI:  Body mass index is 33.82 kg/m.  Estimated Nutritional Needs:   Kcal:  1700-1900 kcal  Protein:  85-95 g/day  Fluid:  >1.7 L/day    Vanessa Kickarly Shaelynn Dragos RD, LDN Clinical Nutrition Pager # - [phone]

## 2016-12-27 NOTE — Brief Op Note (Signed)
12/27/2016  11:34 PM  PATIENT:  Victoria Everett  31 y.o. female  PRE-OPERATIVE DIAGNOSIS:  Infected left foot, likely abscess  POST-OPERATIVE DIAGNOSIS:  Infected left foot, abscess  PROCEDURE:  Procedure(s): IRRIGATION AND DEBRIDEMENT LEFT FOOT (Left), placement of penrose drain, deep cultures  SURGEON:  Surgeon(s) and Role:    Beverely Low* Joyce Heitman, MD - Primary  PHYSICIAN ASSISTANT:   ASSISTANTS: none   ANESTHESIA:   general  EBL:  25 mL   BLOOD ADMINISTERED:none  DRAINS: Penrose drain in the left foot   LOCAL MEDICATIONS USED:  NONE  SPECIMEN:  Source of Specimen:  left foot abscess  DISPOSITION OF SPECIMEN:  micro  COUNTS:  YES  TOURNIQUET:   Total Tourniquet Time Documented: Thigh (Left) - 21 minutes Total: Thigh (Left) - 21 minutes   DICTATION: .Other Dictation: Dictation Number [id number]  PLAN OF CARE: Admit to inpatient   PATIENT DISPOSITION:  PACU - hemodynamically stable.   Delay start of Pharmacological VTE agent (>24hrs) due to surgical blood loss or risk of bleeding: not applicable

## 2016-12-27 NOTE — Progress Notes (Signed)
Per MRI stat foot MRI will be done around 1400 today.

## 2016-12-27 NOTE — Consult Note (Signed)
WOC Nurse wound consult note Reason for Consult:Chronic nonhealing wound with cellulitis to left fifth metatarsal head, plantar aspect of foot.  Edema and erythema present Wound type:infectious, chronic nonhealing neuropathic wound that began as trauma Pressure Injury POA: N/A Measurement: 2 cm x 1.5 cm wound bed 100% adherent fibrin slough Wound ONG:EXBMWUbed:slough Drainage (amount, consistency, odor) minimal serosanguinous  No odor Periwound:tender to touch, erythema and edema Dressing procedure/placement/frequency:Cleanse wound to left foot with NS and pat dry.  Apply Santyl to wound bed.  Cover with NS moist 2x2 and dry dressing/tape.  Change daily.  Send medication home with patient.  Will not follow at this time.  Please re-consult if needed.  Maple HudsonKaren Dexter Signor RN BSN CWON Pager [phone]

## 2016-12-28 ENCOUNTER — Encounter (HOSPITAL_COMMUNITY): Payer: Self-pay | Admitting: Orthopedic Surgery

## 2016-12-28 LAB — BASIC METABOLIC PANEL
Anion gap: 11 (ref 5–15)
BUN: 11 mg/dL (ref 6–20)
CHLORIDE: 102 mmol/L (ref 101–111)
CO2: 24 mmol/L (ref 22–32)
CREATININE: 0.99 mg/dL (ref 0.44–1.00)
Calcium: 8.1 mg/dL — ABNORMAL LOW (ref 8.9–10.3)
GFR calc Af Amer: >60 mL/min (ref >60)
GFR calc non Af Amer: >60 mL/min (ref >60)
Glucose, Bld: 119 mg/dL — ABNORMAL HIGH (ref 65–99)
Potassium: 3.6 mmol/L (ref 3.5–5.1)
Sodium: 137 mmol/L (ref 135–145)

## 2016-12-28 LAB — CBC
HEMATOCRIT: 26.3 % — AB (ref 36.0–46.0)
HEMOGLOBIN: 8.3 g/dL — AB (ref 12.0–15.0)
MCH: 25.4 pg — AB (ref 26.0–34.0)
MCHC: 31.6 g/dL (ref 30.0–36.0)
MCV: 80.4 fL (ref 78.0–100.0)
Platelets: 264 10*3/uL (ref 150–400)
RBC: 3.27 MIL/uL — ABNORMAL LOW (ref 3.87–5.11)
RDW: 12.6 % (ref 11.5–15.5)
WBC: 11.6 10*3/uL — ABNORMAL HIGH (ref 4.0–10.5)

## 2016-12-28 LAB — GLUCOSE, CAPILLARY
GLUCOSE-CAPILLARY: 117 mg/dL — AB (ref 65–99)
GLUCOSE-CAPILLARY: 192 mg/dL — AB (ref 65–99)
Glucose-Capillary: 203 mg/dL — ABNORMAL HIGH (ref 65–99)
Glucose-Capillary: 212 mg/dL — ABNORMAL HIGH (ref 65–99)

## 2016-12-28 MED ORDER — DIPHENHYDRAMINE HCL 25 MG PO CAPS
25.0000 mg | ORAL_CAPSULE | Freq: Three times a day (TID) | ORAL | Status: DC | PRN
  Administered 2016-12-28 – 2016-12-30 (×4): 25 mg via ORAL
  Filled 2016-12-28 (×4): qty 1

## 2016-12-28 MED ORDER — VANCOMYCIN HCL IN DEXTROSE 1-5 GM/200ML-% IV SOLN
1000.0000 mg | Freq: Two times a day (BID) | INTRAVENOUS | Status: DC
  Administered 2016-12-28: 1000 mg via INTRAVENOUS
  Filled 2016-12-28: qty 200

## 2016-12-28 MED ORDER — OXYCODONE HCL 5 MG PO TABS
5.0000 mg | ORAL_TABLET | ORAL | Status: DC | PRN
  Administered 2016-12-31: 5 mg via ORAL
  Filled 2016-12-28: qty 1

## 2016-12-28 NOTE — Evaluation (Signed)
Physical Therapy Evaluation Patient Details Name: Victoria Everett MRN: [id number] DOB: 1985/03/25 Today's Date: 12/28/2016   History of Present Illness  31 yo female admitted with diabetic foot ulcer with abscess. S/P I&D L foot, penrose drain 12/27/16. Hx of DM, hyperthyroidism    Clinical Impression  On eval, pt required Min assist for mobility. She hopped ~20 feet with use of a RW. Educated pt on NWB  L LE precautions per order from Ortho. Pt is not completely compliant with NWB precaution. Verbally reviewed "butt scoot/bump" technique for stairs to get up to apt. Discussed d/c plan-pt is undecided. [place]. Encouraged pt to discuss this with ortho MD and CM. Will follow.  Patient suffers from L foot ulcer s/p I&D, penrose drain placement-currently NWB on L LE, per ortho orders, which impairs her ability to perform daily activities like ambulation, bathing, dressing, meal preparation in the home.  A walker alone will not resolve the issues with performing activities of daily living. A wheelchair will allow patient to safely perform daily activities.  The patient can self propel in the home or has a caregiver who can provide assistance.        Follow Up Recommendations Home health PT;Supervision - Intermittent    Equipment Recommendations  Rolling walker with 5" wheels;Wheelchair ;3in1 (PT)    Recommendations for Other Services OT consult     Precautions / Restrictions Precautions Precautions: Fall Restrictions Weight Bearing Restrictions: Yes LLE Weight Bearing: Non weight bearing      Mobility  Bed Mobility Overal bed mobility: Modified Independent                Transfers Overall transfer level: Needs assistance Equipment used: Rolling walker (2 wheeled) Transfers: Sit to/from Stand Sit to Stand: Min guard         General transfer comment: close guard for safety. Cues for hand/L LE placement, NWB status. Pt unable to stand  without placing some weight through heel.   Ambulation/Gait Ambulation/Gait assistance: Min assist Ambulation Distance (Feet): 20 Feet Assistive device: Rolling walker (2 wheeled) Gait Pattern/deviations: Step-to pattern     General Gait Details: VCs safety, technique, adherence to NWB status. Intermittent assist to steady. Pt c/o mild lightheadedness. She did fairly well with keeping weight of L foot.   Stairs Stairs: (Educated pt on using "butt-bump/scoot" technique for safety, adherence to NWB)          Wheelchair Mobility    Modified Rankin (Stroke Patients Only)       Balance                                             Pertinent Vitals/Pain Pain Assessment: 0-10 Pain Score: 5  Pain Location: L foot at rest and with activity Pain Descriptors / Indicators: Sore;Aching Pain Intervention(s): Limited activity within patient's tolerance    Home Living Family/patient expects to be discharged to:: Private residence Living Arrangements: Spouse/significant other   Type of Home: Apartment Home Access: Stairs to enter Entrance Stairs-Rails: Right Entrance Stairs-Number of Steps: 1 flight Home Layout: One level Home Equipment: None      Prior Function Level of Independence: Independent               Hand Dominance        Extremity/Trunk Assessment   Upper Extremity Assessment  Upper Extremity Assessment: Overall WFL for tasks assessed    Lower Extremity Assessment Lower Extremity Assessment: Generalized weakness    Cervical / Trunk Assessment Cervical / Trunk Assessment: Normal  Communication   Communication: No difficulties  Cognition Arousal/Alertness: Awake/alert Behavior During Therapy: WFL for tasks assessed/performed Overall Cognitive Status: Within Functional Limits for tasks assessed                                        General Comments      Exercises     Assessment/Plan    PT Assessment  Patient needs continued PT services  PT Problem List Decreased balance;Decreased activity tolerance;Decreased mobility;Decreased knowledge of use of DME;Decreased safety awareness;Decreased skin integrity;Pain;Decreased knowledge of precautions       PT Treatment Interventions DME instruction;Gait training;Functional mobility training;Therapeutic activities;Stair training;Therapeutic exercise;Balance training;Patient/family education    PT Goals (Current goals can be found in the Care Plan section)  Acute Rehab PT Goals Patient Stated Goal: [place] [place] [facility] [place] [facility] [facility] Goal Formulation: With patient Time For Goal Achievement: 01/11/17 Potential to Achieve Goals: Good    Frequency Min 3X/week   Barriers to discharge        Co-evaluation               AM-PAC PT "6 Clicks" Daily Activity  Outcome Measure Difficulty turning over in bed (including adjusting bedclothes, sheets and blankets)?: None Difficulty moving from lying on back to sitting on the side of the bed? : None Difficulty sitting down on and standing up from a chair with arms (e.g., wheelchair, bedside commode, etc,.)?: A Little Help needed moving to and from a bed to chair (including a wheelchair)?: A Little Help needed walking in hospital room?: A Little Help needed climbing 3-5 steps with a railing? : A Lot 6 Click Score: 19    End of Session Equipment Utilized During Treatment: Gait belt Activity Tolerance: Patient limited by fatigue Patient left: in chair;with call bell/phone within reach   PT Visit Diagnosis: Muscle weakness (generalized) (M62.81);Difficulty in walking, not elsewhere classified (R26.2);Pain Pain - Right/Left: Left Pain - part of body: Ankle and joints of foot    Time: [id number] PT Time Calculation (min) (ACUTE ONLY): 14 min   Charges:   PT Evaluation $PT Eval Moderate Complexity: 1 Mod     PT G Codes:          Rebeca AlertJannie Armon Orvis, MPT Pager: [phone]

## 2016-12-28 NOTE — Op Note (Signed)
NAMCordelia Pen:  Litaker, Latavia              ACCOUNT NO.:  [id number]  MEDICAL RECORD NO.:  [id number]  LOCATION:  1506                         [facility]  PHYSICIAN:  Almedia BallsSteven R. Ranell PatrickNorris, M.D. DATE OF BIRTH:  1985-10-03  DATE OF PROCEDURE:  12/27/2016 DATE OF DISCHARGE:                              OPERATIVE REPORT   PREOPERATIVE DIAGNOSIS:  Infected left foot, likely abscess.  POSTOPERATIVE DIAGNOSIS:  Left foot infection with abscess.  PROCEDURE PERFORMED:  Left foot incision and drainage with obtaining of deep cultures and placement of Penrose drain.  ATTENDING SURGEON:  Almedia BallsSteven R. Ranell PatrickNorris, M.D.  ASSISTANT:  None.  ANESTHESIA:  General anesthesia was used.  ESTIMATED BLOOD LOSS:  50 mL.  FLUID REPLACEMENT:  1000 mL crystalloid.  INSTRUMENT COUNTS:  Correct.  COMPLICATIONS:  There were no complications  PROPHYLAXIS:  Perioperative antibiotics were given.  SPECIMENS:  We also obtained a surgical wound specimen and sent that to microbiology for aerobic and anaerobic cultures.  INDICATIONS:  The patient is a 31 year old female with a 1660-month history of worsening left foot swelling and pain.  The patient was treated previously for cellulitis, was seen in the [facility], presented to the emergency department with several-day history of increasing pain and swelling in the foot and drainage from a plantar ulcer under the lateral aspect of her left foot.  The patient has been in the hospital for 48 hours, not responding to IV antibiotics and Orthopedics was consulted for concern over an abscess.  MRI scan done today, suspicious for abscess on adjacent to plantar ulcer.  I discussed options with the patient, recommending surgical incision and drainage.  The patient agreed with this.  Informed consent obtained.  DESCRIPTION OF PROCEDURE:  After an adequate level of anesthesia was achieved, the patient was positioned supine on the operating table.  A nonsterile tourniquet  placed on proximal thigh.  The left leg was sterilely prepped and draped in usual manner.  Time-out called.  We initiated surgery by ellipsing out the keratinized tissue and the ulcer on the plantar aspect of the foot, careful to preserve enough tissue to gain closure, this was highly keratinized tissue upon incision.  When material did come out, we then obtained our deep tissue as well as deep fluid from the abscess and sent that for aerobic and anaerobic cultures. Once we removed all devitalized tissue with a rongeur and with a knife and pickups, we were careful not to invade the plantar fifth metatarsophalangeal joint capsule.  We did feel that the infection had spread up to the interspace between the fourth and fifth metatarsals, and there was quite a bit of swelling over the dorsolateral aspect of the foot, so we did go ahead and do an incision dorsally in between the fourth and fifth metatarsals, skin incision with 10 blade and then blunt dissection down through the subcutaneous tissues encountering a little bit of cloudy fluid with hemostat.  We then went down in between the metatarsals and connected the plantar and dorsal aspects of the foot for a thorough drainage of the lateral aspect of the foot, also went plantar to the fourth metatarsal head and also a little bit laterally just  to try and make sure that we broke up all the areas that could be harboring devitalized tissue and/or infection.  Once we had that all exposed, we used 3 liters of pulse lavage irrigation with normal saline through the plantar aspect of the foot also by spreading between the fourth and fifth metatarsals down from the dorsal aspect of the foot, really trying to irrigate thoroughly to decompress and irrigate all areas of soft tissue involvement.  Once we completed our irrigation and debridement and all the tissue looked fresh and clean, we went ahead and placed a Penrose drain from dorsal to plantar and  then sewed our dorsal wound, closed with interrupted nylon suture 2-0 nylon, and then did a loose closure of the plantar wound.  We did want that to drain some, but also to not be too widely open, so we did not want to get the edges of the wound in proximity.  At this point, we placed a dry compressive bandage in place and the patient was awakened and transferred to recovery room in stable condition.     Almedia BallsSteven R. Ranell PatrickNorris, M.D.     SRN/MEDQ  D:  12/27/2016  T:  12/28/2016  Job:  [id number]

## 2016-12-28 NOTE — Progress Notes (Signed)
PROGRESS NOTE    Victoria CullChantel Killings  UJW:[id number] DOB: 1985-09-18 DOA: 12/25/2016 PCP: System, Pcp Not In  Brief Narrative: Victoria Everett is a 31 y.o. female was recently admitted in early October with left foot ulcer and cellulitis at that time was diagnosed with new onset diabetes with hemoglobin A1c of 13.3, she was treated with antibiotics, started on insulin, MRI foot showed no evidence of osteomyelitis, discharged home on oral antibiotics subsequently followed up at the wound center on 10/17 where the ulcer was felt to be healing well,  And she had ABIs done which were normal, subsequently developed increasing foot pain since Monday and generalized body aches hence presented to the emergency room. Noted to have increased pain and swelling of the left lower leg with mild erythema on the dorsum of her foot and chronic healing ulcer at the base of the first metatarsal. Started Abx, 11/5 increased fluctuation and drainage, MRI with small abscess, Ortho Dr.Norris consulted, s/p I&D and drain   Assessment & Plan:   1. Diabetic foot ulcer with cellulitis and small abscess -Admitted with increased pain, swelling of LLE and low-grade fevers and chills -11/5 with increase fluctuance and increased drainage from known ulcer site, MRI noted small abscess -continue IV Vanc , MRI noted soft tissue swelling and small abscess -greatly appreciate Ortho consult per Dr.Norris, s/p I&D and penrose drains and deep cultures -FU cultures  2. Uncontrolled type 2 diabetes, recent diagnosis last month -Last hemoglobin A1c was 13.3 -insulin 7030 was switched to Lantus on admission, CBGs stable -hba1c is 10 now -CBGs stable  3. Anemia secondary to chronic disease and iron deficiency -Anemia panel last month with iron deficiency noted -Now with worsened due to hemodilution, given IV Iron 11/4,  -suspect secondary to menstrual blood loss and acute illness with infections -monitor CBC, will need Iron at DC  4.  Hypertension -Continue lisinopril  DVT prophylaxis:Lovenox Code Status: full code Family Communication:no family at bedside, called and d.w mother 11/5 Disposition Plan: home pending improvement  Consultants:    Procedures:   Antimicrobials:  IV vancomycin 11/3 to current IV Zosyn 11/3 - 11/4  Subjective: -continues to have pain in Foot but improving  Objective: Vitals:   12/28/16 0211 12/28/16 0400 12/28/16 0621 12/28/16 1100  BP: (!) 164/73  (!) 167/88 (!) 142/78  Pulse: (!) 123  (!) 109 (!) 106  Resp: 20  20 20   Temp: (!) 101.3 F (38.5 C) 99.5 F (37.5 C) 99.1 F (37.3 C) 99.7 F (37.6 C)  TempSrc: Oral Oral Oral Oral  SpO2: 92%  98% 93%  Weight:      Height:        Intake/Output Summary (Last 24 hours) at 12/28/2016 1434 Last data filed at 12/28/2016 1049 Gross per 24 hour  Intake 1881.25 ml  Output 25 ml  Net 1856.25 ml   Filed Weights   12/25/16 1757 12/25/16 2307  Weight: 110.2 kg (243 lb) 110 kg (242 lb 8.1 oz)    Examination:  Gen: Awake, Alert, Oriented X 3,  HEENT: PERRLA, Neck supple, no JVD Lungs: Good air movement bilaterally, CTAB CVS: RRR,No Gallops,Rubs or new Murmurs Abd: soft, Non tender, non distended, BS present Extremities: L foot with dressing Skin: no new rashes Psychiatry: Judgement and insight appear normal. Mood & affect appropriate.     Data Reviewed:   CBC: Recent Labs  Lab 12/25/16 1750 12/26/16 0646 12/27/16 0614 12/28/16 0630  WBC 11.7* 9.8 8.6 11.6*  NEUTROABS 9.7*  --   --   --  HGB 9.5* 8.1* 7.9* 8.3*  HCT 30.0* 25.5* 24.7* 26.3*  MCV 81.1 81.2 80.7 80.4  PLT 275 223 226 264   Basic Metabolic Panel: Recent Labs  Lab 12/25/16 1750 12/26/16 0646 12/27/16 0614 12/28/16 0630  NA 139 136 138 137  K 3.5 3.3* 3.9 3.6  CL 105 105 107 102  CO2 25 23 25 24   GLUCOSE 83 103* 111* 119*  BUN 18 12 14 11   CREATININE 0.87 0.82 0.95 0.99  CALCIUM 8.6* 7.6* 8.0* 8.1*  MG  --  1.6*  --   --   PHOS  --  2.3*   --   --    GFR: Estimated Creatinine Clearance: 111.4 mL/min (by C-G formula based on SCr of 0.99 mg/dL). Liver Function Tests: Recent Labs  Lab 12/25/16 1750 12/26/16 0646  AST 20 23  ALT 13* 15  ALKPHOS 68 77  BILITOT 0.7 0.8  PROT 7.5 5.9*  ALBUMIN 3.1* 2.4*   No results for input(s): LIPASE, AMYLASE in the last 168 hours. No results for input(s): AMMONIA in the last 168 hours. Coagulation Profile: No results for input(s): INR, PROTIME in the last 168 hours. Cardiac Enzymes: No results for input(s): CKTOTAL, CKMB, CKMBINDEX, TROPONINI in the last 168 hours. BNP (last 3 results) No results for input(s): PROBNP in the last 8760 hours. HbA1C: Recent Labs    12/25/16 2346  HGBA1C 10.0*   CBG: Recent Labs  Lab 12/27/16 1154 12/27/16 1705 12/27/16 2305 12/28/16 0745 12/28/16 1201  GLUCAP 116* 90 76 117* 192*   Lipid Profile: No results for input(s): CHOL, HDL, LDLCALC, TRIG, CHOLHDL, LDLDIRECT in the last 72 hours. Thyroid Function Tests: Recent Labs    12/26/16 0646  TSH 2.426   Anemia Panel: No results for input(s): VITAMINB12, FOLATE, FERRITIN, TIBC, IRON, RETICCTPCT in the last 72 hours. Urine analysis:    Component Value Date/Time   COLORURINE STRAW (A) 12/25/2016 1734   APPEARANCEUR CLEAR 12/25/2016 1734   LABSPEC 1.011 12/25/2016 1734   PHURINE 7.0 12/25/2016 1734   GLUCOSEU NEGATIVE 12/25/2016 1734   HGBUR MODERATE (A) 12/25/2016 1734   BILIRUBINUR NEGATIVE 12/25/2016 1734   KETONESUR NEGATIVE 12/25/2016 1734   PROTEINUR 100 (A) 12/25/2016 1734   NITRITE NEGATIVE 12/25/2016 1734   LEUKOCYTESUR NEGATIVE 12/25/2016 1734   Sepsis Labs: @LABRCNTIP (procalcitonin:4,lacticidven:4)  ) Recent Results (from the past 240 hour(s))  Blood Culture (routine x 2)     Status: None (Preliminary result)   Collection Time: 12/25/16  5:34 PM  Result Value Ref Range Status   Specimen Description BLOOD LEFT WRIST  Final   Special Requests   Final    BOTTLES  DRAWN AEROBIC AND ANAEROBIC Blood Culture adequate volume   Culture   Final    NO GROWTH 2 DAYS Performed at [facility], [address], [place], [place] [address]    Report Status PENDING  Incomplete  MRSA PCR Screening     Status: None   Collection Time: 12/25/16  9:26 PM  Result Value Ref Range Status   MRSA by PCR NEGATIVE NEGATIVE Final    Comment:        The GeneXpert MRSA Assay (FDA approved for NASAL specimens only), is one component of a comprehensive MRSA colonization surveillance program. It is not intended to diagnose MRSA infection nor to guide or monitor treatment for MRSA infections.   Respiratory Panel by PCR     Status: None   Collection Time: 12/25/16 10:42 PM  Result Value  Ref Range Status   Adenovirus NOT DETECTED NOT DETECTED Final   Coronavirus 229E NOT DETECTED NOT DETECTED Final   Coronavirus HKU1 NOT DETECTED NOT DETECTED Final   Coronavirus NL63 NOT DETECTED NOT DETECTED Final   Coronavirus OC43 NOT DETECTED NOT DETECTED Final   Metapneumovirus NOT DETECTED NOT DETECTED Final   Rhinovirus / Enterovirus NOT DETECTED NOT DETECTED Final   Influenza A NOT DETECTED NOT DETECTED Final   Influenza B NOT DETECTED NOT DETECTED Final   Parainfluenza Virus 1 NOT DETECTED NOT DETECTED Final   Parainfluenza Virus 2 NOT DETECTED NOT DETECTED Final   Parainfluenza Virus 3 NOT DETECTED NOT DETECTED Final   Parainfluenza Virus 4 NOT DETECTED NOT DETECTED Final   Respiratory Syncytial Virus NOT DETECTED NOT DETECTED Final   Bordetella pertussis NOT DETECTED NOT DETECTED Final   Chlamydophila pneumoniae NOT DETECTED NOT DETECTED Final   Mycoplasma pneumoniae NOT DETECTED NOT DETECTED Final    Comment: Performed at [facility], [address] [place], [place] [address]  Blood Culture (routine x 2)     Status: None (Preliminary result)   Collection Time: 12/25/16 11:46 PM  Result Value Ref Range Status   Specimen Description BLOOD RIGHT HAND  Final    Special Requests   Final    BOTTLES DRAWN AEROBIC ONLY Blood Culture adequate volume   Culture   Final    NO GROWTH 2 DAYS Performed at [facility], [address] [place], [place] [address]    Report Status PENDING  Incomplete  Surgical pcr screen     Status: Abnormal   Collection Time: 12/27/16  6:45 PM  Result Value Ref Range Status   MRSA, PCR NEGATIVE NEGATIVE Final   Staphylococcus aureus POSITIVE (A) NEGATIVE Final    Comment: (NOTE) The Xpert SA Assay (FDA approved for NASAL specimens in patients 17 years of age and older), is one component of a comprehensive surveillance program. It is not intended to diagnose infection nor to guide or monitor treatment.   Aerobic/Anaerobic Culture (surgical/deep wound)     Status: None (Preliminary result)   Collection Time: 12/27/16 10:31 PM  Result Value Ref Range Status   Specimen Description ABSCESS  Final   Special Requests NONE  Final   Gram Stain   Final    FEW WBC PRESENT, PREDOMINANTLY PMN NO SQUAMOUS EPITHELIAL CELLS SEEN FEW GRAM POSITIVE COCCI IN CLUSTERS Performed at [facility], [address] [place], [place] [address]    Culture PENDING  Incomplete   Report Status PENDING  Incomplete         Radiology Studies: Mr Foot Left Wo Contrast  Result Date: 12/27/2016 CLINICAL DATA:  Worsening left foot pain.  Diabetes. EXAM: MRI OF THE LEFT FOOT WITHOUT CONTRAST TECHNIQUE: Multiplanar, multisequence MR imaging of the left forefoot was performed. No intravenous contrast was administered. COMPARISON:  Radiographs from 12/25/2016 FINDINGS: Bones/Joint/Cartilage No marrow signal abnormality to indicate osteomyelitis or fracture. No joint dislocations. Ligaments Noncontributory Muscles and Tendons Mild diffuse intramuscular edema of the forefoot consistent with myositis. No muscle atrophy or mass. No evidence of tenosynovitis. The extensor and flexor tendons crossing the forefoot of maintains signal intensity and  morphology. No appearing tendon tear. Soft tissues Soft tissue ulceration noted along the plantar aspect of the forefoot at the level of the fifth metatarsal head and neck. Associated soft tissue fluid collection contiguous with the soft tissue ulcer measuring 2.5 x 1.9 x 2.9 cm consistent with an  abscess. Diffuse cellulitis and myositis of the adjacent forefoot without intramuscular abscess identified IMPRESSION: 1. Soft tissue ulceration along the plantar aspect of the forefoot adjacent to the head of the fifth metatarsal with slightly lobular subcutaneous 2.5 x 1.9 x 2.3 cm fluid collection associated with the ulcer. Suspect soft tissue abscess though study is limited for that assessment given lack of IV contrast. 2. No marrow signal abnormality to indicate osteomyelitis, fracture or bone destruction. 3. Edema of the subcutaneous soft tissues and forefoot musculature consistent with cellulitis and/or myositis. Electronically Signed   By: Tollie Ethavid  Kwon M.D.   On: 12/27/2016 15:03        Scheduled Meds: . amLODipine  5 mg Oral Daily  . collagenase  1 application Topical Daily  . docusate sodium  100 mg Oral BID  . enoxaparin (LOVENOX) injection  40 mg Subcutaneous QHS  . feeding supplement (PRO-STAT SUGAR FREE 64)  30 mL Oral BID  . insulin aspart  0-15 Units Subcutaneous TID WC  . insulin aspart  0-5 Units Subcutaneous QHS  . insulin glargine  25 Units Subcutaneous QHS  . lisinopril  10 mg Oral Daily   Continuous Infusions: . sodium chloride 75 mL/hr at 12/28/16 0104  . ferumoxytol Stopped (12/26/16 2300)  . vancomycin       LOS: 3 days    Time spent: 35min    Zannie CovePreetha Syd Manges, MD [facility] Page via [email or web address], password [email or web address] After 7PM please contact night-coverage  12/28/2016, 2:34 PM

## 2016-12-28 NOTE — Progress Notes (Signed)
   Subjective: 1 Day Post-Op Procedure(s) (LRB): IRRIGATION AND DEBRIDEMENT LEFT FOOT (Left)  C/o moderate pain to her foot this morning Pain is bearable currently Denies any new symptoms or issues Patient reports pain as moderate.  Objective:   VITALS:   Vitals:   12/28/16 0400 12/28/16 0621  BP:  (!) 167/88  Pulse:  (!) 109  Resp:  20  Temp: 99.5 F (37.5 C) 99.1 F (37.3 C)  SpO2:  98%    Left foot dressing in place nv intact to other toes No signs of drainage   LABS Recent Labs    12/26/16 0646 12/27/16 0614 12/28/16 0630  HGB 8.1* 7.9* 8.3*  HCT 25.5* 24.7* 26.3*  WBC 9.8 8.6 11.6*  PLT 223 226 264    Recent Labs    12/26/16 0646 12/27/16 0614 12/28/16 0630  NA 136 138 137  K 3.3* 3.9 3.6  BUN 12 14 11   CREATININE 0.82 0.95 0.99  GLUCOSE 103* 111* 119*     Assessment/Plan: 1 Day Post-Op Procedure(s) (LRB): IRRIGATION AND DEBRIDEMENT LEFT FOOT (Left) Continue pain management Activity as tolerated Will continue to monitor her progress    Alphonsa OverallBrad Dixon, MPAS, PA-C  12/28/2016, 7:45 AM

## 2016-12-28 NOTE — Plan of Care (Signed)
Patient is alert and oriented x4 during the shift; no fall occurred during the shift; progressing to achieve the expected outcomes at DC

## 2016-12-28 NOTE — Progress Notes (Signed)
Pharmacy Antibiotic Note  Victoria Everett is a 31 y.o. female c/o left leg pain and swelling admitted on 12/25/2016 with cellulitis and abscess left foot.  Pharmacy has been consulted for vancomycin dosing. 12/28/2016 Full abx day #3. S/p I&D L foot 11/5. WBC up to 11.6, AF, 11/5 abscess cx: few gram positive cocci in clusters. Creat 0.99   Plan: Change Vancomycin 1250 mg IV q12h to vancomycin 1 gm q12 for est AUC 447 with Cr 0.99  Goal AUC 400-500 F/u scr/cultures   Height: 5\' 11"  (180.3 cm) Weight: 242 lb 8.1 oz (110 kg) IBW/kg (Calculated) : 70.8  Temp (24hrs), Avg:99.7 F (37.6 C), Min:99 F (37.2 C), Max:101.3 F (38.5 C)  Recent Labs  Lab 12/25/16 1750 12/25/16 1756 12/25/16 1942 12/26/16 0646 12/27/16 0614 12/28/16 0630  WBC 11.7*  --   --  9.8 8.6 11.6*  CREATININE 0.87  --   --  0.82 0.95 0.99  LATICACIDVEN  --  0.94 1.11  --   --   --     Estimated Creatinine Clearance: 111.4 mL/min (by C-G formula based on SCr of 0.99 mg/dL).    No Known Allergies Antimicrobials this admission: 11/3 zosyn >> 11/4 11/3 vancomycin >>   Dose adjustments this admission: 11/6 vanc 1250 q12>>1 gm q12 Microbiology results: 11/3 BCx: ngtd 11/3 MRSA PCR: negative 11/5 SA PCR + 11/3 HIV neg 11/3 flu neg 11/3 Respiratory panel by PCR: none detected 11/5 abscess >>few GPcocci in clusters   Thank you for allowing pharmacy to be a part of this patient's care. Herby AbrahamMichelle T. Markese Bloxham, Pharm.D. [phone] 12/28/2016 11:10 AM

## 2016-12-29 LAB — VANCOMYCIN, RANDOM: VANCOMYCIN RM: 16

## 2016-12-29 LAB — CBC
HCT: 23.7 % — ABNORMAL LOW (ref 36.0–46.0)
HEMOGLOBIN: 7.5 g/dL — AB (ref 12.0–15.0)
MCH: 25.5 pg — AB (ref 26.0–34.0)
MCHC: 31.6 g/dL (ref 30.0–36.0)
MCV: 80.6 fL (ref 78.0–100.0)
PLATELETS: 259 10*3/uL (ref 150–400)
RBC: 2.94 MIL/uL — ABNORMAL LOW (ref 3.87–5.11)
RDW: 12.6 % (ref 11.5–15.5)
WBC: 8.1 10*3/uL (ref 4.0–10.5)

## 2016-12-29 LAB — BASIC METABOLIC PANEL
ANION GAP: 8 (ref 5–15)
BUN: 24 mg/dL — AB (ref 6–20)
CALCIUM: 7.9 mg/dL — AB (ref 8.9–10.3)
CO2: 25 mmol/L (ref 22–32)
CREATININE: 1.26 mg/dL — AB (ref 0.44–1.00)
Chloride: 102 mmol/L (ref 101–111)
GFR calc Af Amer: >60 mL/min (ref >60)
GFR, EST NON AFRICAN AMERICAN: 56 mL/min — AB (ref >60)
GLUCOSE: 147 mg/dL — AB (ref 65–99)
Potassium: 3.7 mmol/L (ref 3.5–5.1)
Sodium: 135 mmol/L (ref 135–145)

## 2016-12-29 LAB — CREATININE, URINE, RANDOM: CREATININE, URINE: 47.77 mg/dL

## 2016-12-29 LAB — SODIUM, URINE, RANDOM: Sodium, Ur: 31 mmol/L

## 2016-12-29 LAB — URINALYSIS, ROUTINE W REFLEX MICROSCOPIC
Bilirubin Urine: NEGATIVE
Glucose, UA: NEGATIVE mg/dL
KETONES UR: NEGATIVE mg/dL
Nitrite: NEGATIVE
PH: 5 (ref 5.0–8.0)
Protein, ur: 100 mg/dL — AB
Specific Gravity, Urine: 1.006 (ref 1.005–1.030)

## 2016-12-29 LAB — GLUCOSE, CAPILLARY
GLUCOSE-CAPILLARY: 138 mg/dL — AB (ref 65–99)
GLUCOSE-CAPILLARY: 152 mg/dL — AB (ref 65–99)
Glucose-Capillary: 157 mg/dL — ABNORMAL HIGH (ref 65–99)
Glucose-Capillary: 186 mg/dL — ABNORMAL HIGH (ref 65–99)

## 2016-12-29 MED ORDER — VANCOMYCIN HCL 10 G IV SOLR
1500.0000 mg | INTRAVENOUS | Status: DC
  Administered 2016-12-29 – 2016-12-31 (×3): 1500 mg via INTRAVENOUS
  Filled 2016-12-29 (×3): qty 1500

## 2016-12-29 MED ORDER — MUPIROCIN 2 % EX OINT
1.0000 "application " | TOPICAL_OINTMENT | Freq: Two times a day (BID) | CUTANEOUS | Status: DC
  Administered 2016-12-29 – 2016-12-31 (×6): 1 via NASAL
  Filled 2016-12-29 (×2): qty 22

## 2016-12-29 MED ORDER — CHLORHEXIDINE GLUCONATE CLOTH 2 % EX PADS
6.0000 | MEDICATED_PAD | Freq: Every day | CUTANEOUS | Status: DC
  Administered 2016-12-29 – 2016-12-31 (×3): 6 via TOPICAL

## 2016-12-29 NOTE — Anesthesia Postprocedure Evaluation (Signed)
Anesthesia Post Note  Patient: Victoria Everett  Procedure(s) Performed: IRRIGATION AND DEBRIDEMENT LEFT FOOT (Left Foot)     Patient location during evaluation: PACU Anesthesia Type: General Level of consciousness: awake and alert Pain management: pain level controlled Vital Signs Assessment: post-procedure vital signs reviewed and stable Respiratory status: spontaneous breathing, nonlabored ventilation, respiratory function stable and patient connected to nasal cannula oxygen Cardiovascular status: blood pressure returned to baseline and stable Postop Assessment: no apparent nausea or vomiting Anesthetic complications: no    Last Vitals:  Vitals:   12/28/16 2115 12/29/16 0558  BP: 134/71 140/78  Pulse: 94 95  Resp: 18 18  Temp: 36.9 C 37.3 C  SpO2: 96% 98%    Last Pain:  Vitals:   12/29/16 0805  TempSrc:   PainSc: 9    Pain Goal: Patients Stated Pain Goal: 1 (12/29/16 0601)               Cecile HearingStephen Edward Turk

## 2016-12-29 NOTE — Progress Notes (Signed)
Physical Therapy Treatment Patient Details Name: Victoria Everett MRN: [id number] DOB: May 08, 1985 Today's Date: 12/29/2016    History of Present Illness 31 yo female admitted with diabetic foot ulcer with abscess. S/P I&D L foot, penrose drain 12/27/16. Hx of DM, hyperthyroidism    PT Comments    Pt initially refusing to participate. Max encouragement required to get pt to agree to attempt mobility. Pt is capable of donning shoe however she requested PT assist with that on today. She stood at EOB with RW. She did c/o increased pain on today compared to yesterday. Educated pt on proper technique/use of DARCO shoe (weight through heel only and to avoid allowing shoe to rock forward). Pt acted as if she could not take any steps on today. On yesterday, she demonstrated good UE strength while hopping ~20 feet. Pt was not willing to put forth max effort on today. Will continue to follow.     Follow Up Recommendations  Home health PT;Supervision/Assistance - 24 hour     Equipment Recommendations  Rolling walker with 5" wheels;Wheelchair ;3in1 (PT)    Recommendations for Other Services       Precautions / Restrictions Precautions Precautions: Fall Required Braces or Orthoses: Other Brace/Splint Other Brace/Splint: DARCO post op shoe L foot Restrictions Weight Bearing Restrictions: No LLE Weight Bearing: Weight bearing as tolerated Other Position/Activity Restrictions: with DARCO shoe. Weight through heel only. Called office to get clarification of WB status-"FWB with use of DARCO shoe (weight through heel only)."   Mobility  Bed Mobility Overal bed mobility: Modified Independent                Transfers Overall transfer level: Needs assistance Equipment used: Rolling walker (2 wheeled) Transfers: Sit to/from Stand Sit to Stand: Min guard;From elevated surface         General transfer comment: close guard for safety. Cues for hand/L LE placement.   Ambulation/Gait              General Gait Details: Pt unwilling to try taking any steps.    Stairs            Wheelchair Mobility    Modified Rankin (Stroke Patients Only)       Balance                                            Cognition Arousal/Alertness: Awake/alert Behavior During Therapy: WFL for tasks assessed/performed Overall Cognitive Status: Within Functional Limits for tasks assessed                                        Exercises      General Comments        Pertinent Vitals/Pain Pain Assessment: Faces Faces Pain Scale: Hurts whole lot Pain Location: L foot with activity/WBing Pain Descriptors / Indicators: Grimacing;Guarding;Crying Pain Intervention(s): Limited activity within patient's tolerance;Repositioned;Premedicated before session    Home Living                      Prior Function            PT Goals (current goals can now be found in the care plan section) Progress towards PT goals: Not progressing toward goals - comment(pt initially declining participation. Max encouragement for pt to  put forth effort with mobilizing on today)    Frequency    Min 3X/week      PT Plan Current plan remains appropriate    Co-evaluation              AM-PAC PT "6 Clicks" Daily Activity  Outcome Measure  Difficulty turning over in bed (including adjusting bedclothes, sheets and blankets)?: None Difficulty moving from lying on back to sitting on the side of the bed? : None Difficulty sitting down on and standing up from a chair with arms (e.g., wheelchair, bedside commode, etc,.)?: A Little Help needed moving to and from a bed to chair (including a wheelchair)?: A Little Help needed walking in hospital room?: A Little Help needed climbing 3-5 steps with a railing? : A Lot 6 Click Score: 19    End of Session   Activity Tolerance: Patient limited by pain Patient left: in bed;with call bell/phone within reach;with  family/visitor present   PT Visit Diagnosis: Muscle weakness (generalized) (M62.81);Difficulty in walking, not elsewhere classified (R26.2);Pain Pain - Right/Left: Left Pain - part of body: Ankle and joints of foot     Time: 1358-1411 PT Time Calculation (min) (ACUTE ONLY): 13 min  Charges:  $Therapeutic Activity: 8-22 mins                    G Codes:          Rebeca AlertJannie Sekai Gitlin, MPT Pager: [phone]

## 2016-12-29 NOTE — Progress Notes (Addendum)
   Subjective: 2 Days Post-Op Procedure(s) (LRB): IRRIGATION AND DEBRIDEMENT LEFT FOOT (Left)  C/o mild to moderate pain in the foot Denies any numbness or tingling o Patient reports pain as moderate.  Objective:   VITALS:   Vitals:   12/28/16 2115 12/29/16 0558  BP: 134/71 140/78  Pulse: 94 95  Resp: 18 18  Temp: 98.4 F (36.9 C) 99.1 F (37.3 C)  SpO2: 96% 98%    Left foot dressing removed and drain pulled Healing incisions to plantar and dorsal aspect of her foot No drainage or erythema Edema dramatically reduced  LABS Recent Labs    12/27/16 0614 12/28/16 0630 12/29/16 0544  HGB 7.9* 8.3* 7.5*  HCT 24.7* 26.3* 23.7*  WBC 8.6 11.6* 8.1  PLT 226 264 259    Recent Labs    12/27/16 0614 12/28/16 0630 12/29/16 0544  NA 138 137 135  K 3.9 3.6 3.7  BUN 14 11 24*  CREATININE 0.95 0.99 1.26*  GLUCOSE 111* 119* 147*     Assessment/Plan: 2 Days Post-Op Procedure(s) (LRB): IRRIGATION AND DEBRIDEMENT LEFT FOOT (Left) Continue IV antibiotics D/c planning Wound healing well Recommend Darco shoe from the ortho tech to aid with walking  I have seen and examined the patient and agree with the above assessment and plan. Cultures growing Staph from surgery. Vanco covering. Daily dry dressing changes    Alphonsa OverallBrad Dixon, MPAS, PA-C  12/29/2016, 9:04 AM

## 2016-12-29 NOTE — Progress Notes (Signed)
Pharmacy Antibiotic Note  Victoria Everett is a 31 y.o. female c/o left leg pain and swelling admitted on 12/25/2016 with cellulitis and abscess left foot.  Pharmacy has been consulted for vancomycin dosing. 12/29/2016   Today, 12/29/2016 Day #4 abx - Renal: Scr trending up (no strict I/O to eval UOP)  - checked vancomycin level 11/7 at 0815a = 16 mcg/mL (essentially a trough but               dose reduced to 1gm IV q12h last evening for SCr trending upward - WBC improved - afebrile - abscess culture - few GPC clusters  Plan:  Change Vancomycin 1500mg  IV q24h, start tonight  F/u SCr in am and possible need to check further levels  Await final cultures and ability to change to PO antibiotic  Height: 5\' 11"  (180.3 cm) Weight: 242 lb 8.1 oz (110 kg) IBW/kg (Calculated) : 70.8  Temp (24hrs), Avg:98.9 F (37.2 C), Min:98.4 F (36.9 C), Max:99.7 F (37.6 C)  Recent Labs  Lab 12/25/16 1750 12/25/16 1756 12/25/16 1942 12/26/16 0646 12/27/16 0614 12/28/16 0630 12/29/16 0544 12/29/16 0815  WBC 11.7*  --   --  9.8 8.6 11.6* 8.1  --   CREATININE 0.87  --   --  0.82 0.95 0.99 1.26*  --   LATICACIDVEN  --  0.94 1.11  --   --   --   --   --   VANCORANDOM  --   --   --   --   --   --   --  16    Estimated Creatinine Clearance: 87.5 mL/min (A) (by C-G formula based on SCr of 1.26 mg/dL (H)).    No Known Allergies Antimicrobials this admission: 11/3 zosyn >> 11/4 11/3 vancomycin >>   Dose adjustments this admission: 11/6 vanc 1250 q12>>1 gm q12 11/7 0815 vancomycin random (trough) = 16 mcg/mL, adjust to 1500mg  IV q24h  Microbiology results: 11/3 BCx: ngtd 11/3 MRSA PCR: negative 11/5 SA PCR + 11/3 HIV neg 11/3 flu neg 11/3 Respiratory panel by PCR: none detected 11/5 abscess >>few GPcocci in clusters   Thank you for allowing pharmacy to be a part of this patient's care.  Juliette Alcideustin Dawsyn Ramsaran, PharmD, BCPS.   Pager: [phone] 12/29/2016 10:13 AM

## 2016-12-29 NOTE — Progress Notes (Signed)
PROGRESS NOTE    Victoria Everett  WUJ:[id number] DOB: [birth year] DOA: 12/25/2016 PCP: System, Pcp Not In    Brief Narrative:  Victoria Everett 31 y.o.female was recently admitted in early October with left foot ulcer and cellulitis at that time was diagnosed with new onset diabetes with hemoglobin A1c of 13.3, she was treated with antibiotics, started on insulin, MRI foot showed no evidence of osteomyelitis, discharged home on oral antibiotics subsequently followed up at the wound center on 10/17 where the ulcer was felt to be healing well,  And she had ABIs done which were normal, subsequently developed increasing foot pain since Monday and generalized body aches hence presented to the emergency room. Noted to have increased pain and swelling of the left lower leg with mild erythema on the dorsum of her foot and chronic healing ulcer at the base of the first metatarsal. Started Abx, 11/5 increased fluctuation and drainage, MRI with small abscess, Ortho Dr.Norris consulted, s/p I&D and drain  Assessment & Plan:   Active Problems:   Cellulitis of foot, left   Normochromic normocytic anemia   Sepsis (HCC)   DM2 (diabetes mellitus, type 2) (HCC)   Abscess of left foot   1. Diabetic foot ulcer with cellulitis and small abscess -Admitted with increased pain, swelling of LLE and low-grade fevers and chills -11/5 with increase fluctuance and increased drainage from known ulcer site, MRI noted small abscess -continue IV Vanc , MRI noted soft tissue swelling and small abscess -greatly appreciate Ortho consult per Dr.Norris, s/p I&D and penrose drains and deep cultures -FU cultures  2. Uncontrolled type 2 diabetes, recent diagnosis last month -Last hemoglobin A1c was 13.3 -insulin 7030 was switched to Lantus on admission, CBGs stable -hba1c is 10 now -CBGs stable  3. AKI: Baseline Cr less than 1, increased to 1.26 today.  Still making urine.  Will follow up UA as well as FeNA.  [name]  receiving vancomycin.  Also receiving lisinopril which we'll hold for now.  Continue IVF.  3. Anemia secondary to chronic disease and iron deficiency -Anemia panel last month with iron deficiency noted -Now with worsened due to hemodilution, given IV Iron 11/4,  -suspect secondary to menstrual blood loss and acute illness with infections -monitor CBC, will need Iron at DC  4. Hypertension -Holding lisinopril with above.   D/c planning, is from [place] and would like to go there after discharge.   DVT prophylaxis: SCD Code Status: full  Family Communication: girlfriend at bedside Disposition Plan:pending improement   Consultants:   ortho  Procedures: (Don't include imaging studies which can be auto populated. Include things that cannot be auto populated i.e. Echo, Carotid and venous dopplers, Foley, Bipap, HD, tubes/drains, wound vac, central lines etc)  LE US Final Interpretation Left  There is no evidence of deep vein thrombosis in the lower extremity. No superficial venous thrombosis Enlarged lymph nodes visualized in the groin.  S/p irrigation and debridement of L foot by ortho    Antimicrobials: (specify start and planned stop date. Auto populated tables are space occupying and do not give end dates) Anti-infectives (From admission, onward)   Start     Dose/Rate Route Frequency Ordered Stop   12/29/16 1800  vancomycin (VANCOCIN) 1,500 mg in sodium chloride 0.9 % 500 mL IVPB     1,500 mg 250 mL/hr over 120 Minutes Intravenous Every 24 hours 12/29/16 1020     12/28/16 2000  vancomycin (VANCOCIN) IVPB 1000 mg/200 mL premix  Status:  Discontinued  1,000 mg 200 mL/hr over 60 Minutes Intravenous Every 12 hours 12/28/16 1137 12/29/16 0711   12/28/16 0600  clindamycin (CLEOCIN) IVPB 900 mg  Status:  Discontinued     900 mg 100 mL/hr over 30 Minutes Intravenous On call to O.R. 12/27/16 1812 12/27/16 2343   12/26/16 0800  vancomycin (VANCOCIN) 1,250 mg in sodium  chloride 0.9 % 250 mL IVPB  Status:  Discontinued     1,250 mg 166.7 mL/hr over 90 Minutes Intravenous Every 12 hours 12/25/16 2336 12/28/16 1137   12/26/16 0200  piperacillin-tazobactam (ZOSYN) IVPB 3.375 g  Status:  Discontinued     3.375 g 12.5 mL/hr over 240 Minutes Intravenous Every 8 hours 12/25/16 2314 12/26/16 1231   12/25/16 2315  vancomycin (VANCOCIN) IVPB 1000 mg/200 mL premix     1,000 mg 200 mL/hr over 60 Minutes Intravenous NOW 12/25/16 2314 12/26/16 0027   12/25/16 1745  piperacillin-tazobactam (ZOSYN) IVPB 3.375 g     3.375 g 100 mL/hr over 30 Minutes Intravenous  Once 12/25/16 1738 12/25/16 1940   12/25/16 1745  vancomycin (VANCOCIN) IVPB 1000 mg/200 mL premix     1,000 mg 200 mL/hr over 60 Minutes Intravenous  Once 12/25/16 1738 12/25/16 1950         Subjective: Feeling ok.  Better after surgery.  Objective: Vitals:   12/28/16 1100 12/28/16 1515 12/28/16 2115 12/29/16 0558  BP: (!) 142/78 (!) 148/72 134/71 140/78  Pulse: (!) 106 (!) 106 94 95  Resp: 20  18 18   Temp: 99.7 F (37.6 C) 98.5 F (36.9 C) 98.4 F (36.9 C) 99.1 F (37.3 C)  TempSrc: Oral Oral Oral Oral  SpO2: 93% 100% 96% 98%  Weight:      Height:        Intake/Output Summary (Last 24 hours) at 12/29/2016 1332 Last data filed at 12/29/2016 0823 Gross per 24 hour  Intake 480 ml  Output 2 ml  Net 478 ml   Filed Weights   12/25/16 1757 12/25/16 2307  Weight: 110.2 kg (243 lb) 110 kg (242 lb 8.1 oz)    Examination:  General exam: Appears calm and comfortable  Respiratory system: Clear to auscultation. Respiratory effort normal. Cardiovascular system: S1 & S2 heard, RRR. No JVD, murmurs, rubs, gallops or clicks. No pedal edema. Gastrointestinal system: Abdomen is nondistended, soft and nontender. No organomegaly or masses felt. Normal bowel sounds heard. Central nervous system: Alert and oriented. No focal neurological deficits. Extremities: LLE with dressing intact.  LEE>RLE.  Skin: No  rashes, lesions or ulcers Psychiatry: Judgement and insight appear normal. Mood & affect appropriate.     Data Reviewed: I have personally reviewed following labs and imaging studies  CBC: Recent Labs  Lab 12/25/16 1750 12/26/16 0646 12/27/16 0614 12/28/16 0630 12/29/16 0544  WBC 11.7* 9.8 8.6 11.6* 8.1  NEUTROABS 9.7*  --   --   --   --   HGB 9.5* 8.1* 7.9* 8.3* 7.5*  HCT 30.0* 25.5* 24.7* 26.3* 23.7*  MCV 81.1 81.2 80.7 80.4 80.6  PLT 275 223 226 264 259   Basic Metabolic Panel: Recent Labs  Lab 12/25/16 1750 12/26/16 0646 12/27/16 0614 12/28/16 0630 12/29/16 0544  NA 139 136 138 137 135  K 3.5 3.3* 3.9 3.6 3.7  CL 105 105 107 102 102  CO2 25 23 25 24 25   GLUCOSE 83 103* 111* 119* 147*  BUN 18 12 14 11  24*  CREATININE 0.87 0.82 0.95 0.99 1.26*  CALCIUM 8.6* 7.6* 8.0*  8.1* 7.9*  MG  --  1.6*  --   --   --   PHOS  --  2.3*  --   --   --    GFR: Estimated Creatinine Clearance: 87.5 mL/min (Ludy Messamore) (by C-G formula based on SCr of 1.26 mg/dL (H)). Liver Function Tests: Recent Labs  Lab 12/25/16 1750 12/26/16 0646  AST 20 23  ALT 13* 15  ALKPHOS 68 77  BILITOT 0.7 0.8  PROT 7.5 5.9*  ALBUMIN 3.1* 2.4*   No results for input(s): LIPASE, AMYLASE in the last 168 hours. No results for input(s): AMMONIA in the last 168 hours. Coagulation Profile: No results for input(s): INR, PROTIME in the last 168 hours. Cardiac Enzymes: No results for input(s): CKTOTAL, CKMB, CKMBINDEX, TROPONINI in the last 168 hours. BNP (last 3 results) No results for input(s): PROBNP in the last 8760 hours. HbA1C: No results for input(s): HGBA1C in the last 72 hours. CBG: Recent Labs  Lab 12/28/16 1201 12/28/16 1656 12/28/16 2109 12/29/16 0714 12/29/16 1106  GLUCAP 192* 203* 212* 138* 157*   Lipid Profile: No results for input(s): CHOL, HDL, LDLCALC, TRIG, CHOLHDL, LDLDIRECT in the last 72 hours. Thyroid Function Tests: No results for input(s): TSH, T4TOTAL, FREET4, T3FREE,  THYROIDAB in the last 72 hours. Anemia Panel: No results for input(s): VITAMINB12, FOLATE, FERRITIN, TIBC, IRON, RETICCTPCT in the last 72 hours. Sepsis Labs: Recent Labs  Lab 12/25/16 1756 12/25/16 1942 12/25/16 2346  PROCALCITON  --   --  <0.10  LATICACIDVEN 0.94 1.11  --     Recent Results (from the past 240 hour(s))  Blood Culture (routine x 2)     Status: None (Preliminary result)   Collection Time: 12/25/16  5:34 PM  Result Value Ref Range Status   Specimen Description BLOOD LEFT WRIST  Final   Special Requests   Final    BOTTLES DRAWN AEROBIC AND ANAEROBIC Blood Culture adequate volume   Culture   Final    NO GROWTH 2 DAYS Performed at [facility], [address] [place], [place] [address]    Report Status PENDING  Incomplete  MRSA PCR Screening     Status: None   Collection Time: 12/25/16  9:26 PM  Result Value Ref Range Status   MRSA by PCR NEGATIVE NEGATIVE Final    Comment:        The GeneXpert MRSA Assay (FDA approved for NASAL specimens only), is one component of Harpreet Pompey comprehensive MRSA colonization surveillance program. It is not intended to diagnose MRSA infection nor to guide or monitor treatment for MRSA infections.   Respiratory Panel by PCR     Status: None   Collection Time: 12/25/16 10:42 PM  Result Value Ref Range Status   Adenovirus NOT DETECTED NOT DETECTED Final   Coronavirus 229E NOT DETECTED NOT DETECTED Final   Coronavirus HKU1 NOT DETECTED NOT DETECTED Final   Coronavirus NL63 NOT DETECTED NOT DETECTED Final   Coronavirus OC43 NOT DETECTED NOT DETECTED Final   Metapneumovirus NOT DETECTED NOT DETECTED Final   Rhinovirus / Enterovirus NOT DETECTED NOT DETECTED Final   Influenza Vaudie Engebretsen NOT DETECTED NOT DETECTED Final   Influenza B NOT DETECTED NOT DETECTED Final   Parainfluenza Virus 1 NOT DETECTED NOT DETECTED Final   Parainfluenza Virus 2 NOT DETECTED NOT DETECTED Final   Parainfluenza Virus 3 NOT DETECTED NOT DETECTED Final    Parainfluenza Virus 4 NOT DETECTED NOT DETECTED Final   Respiratory Syncytial Virus NOT DETECTED NOT  DETECTED Final   Bordetella pertussis NOT DETECTED NOT DETECTED Final   Chlamydophila pneumoniae NOT DETECTED NOT DETECTED Final   Mycoplasma pneumoniae NOT DETECTED NOT DETECTED Final    Comment: Performed at [facility], [address], [place], [place] [address]  Blood Culture (routine x 2)     Status: None (Preliminary result)   Collection Time: 12/25/16 11:46 PM  Result Value Ref Range Status   Specimen Description BLOOD RIGHT HAND  Final   Special Requests   Final    BOTTLES DRAWN AEROBIC ONLY Blood Culture adequate volume   Culture   Final    NO GROWTH 2 DAYS Performed at [facility], [address] [place], [place] [address]    Report Status PENDING  Incomplete  Surgical pcr screen     Status: Abnormal   Collection Time: 12/27/16  6:45 PM  Result Value Ref Range Status   MRSA, PCR NEGATIVE NEGATIVE Final   Staphylococcus aureus POSITIVE (Arna Luis) NEGATIVE Final    Comment: (NOTE) The Xpert SA Assay (FDA approved for NASAL specimens in patients 61 years of age and older), is one component of Maeson Purohit comprehensive surveillance program. It is not intended to diagnose infection nor to guide or monitor treatment.   Aerobic/Anaerobic Culture (surgical/deep wound)     Status: None (Preliminary result)   Collection Time: 12/27/16 10:31 PM  Result Value Ref Range Status   Specimen Description ABSCESS  Final   Special Requests NONE  Final   Gram Stain   Final    FEW WBC PRESENT, PREDOMINANTLY PMN NO SQUAMOUS EPITHELIAL CELLS SEEN FEW GRAM POSITIVE COCCI IN CLUSTERS    Culture   Final    CULTURE REINCUBATED FOR BETTER GROWTH Performed at [facility], [address], [place], [place] [address]    Report Status PENDING  Incomplete         Radiology Studies: Mr Foot Left Wo Contrast  Result Date: 12/27/2016 CLINICAL DATA:  Worsening left foot pain.  Diabetes.  EXAM: MRI OF THE LEFT FOOT WITHOUT CONTRAST TECHNIQUE: Multiplanar, multisequence MR imaging of the left forefoot was performed. No intravenous contrast was administered. COMPARISON:  Radiographs from 12/25/2016 FINDINGS: Bones/Joint/Cartilage No marrow signal abnormality to indicate osteomyelitis or fracture. No joint dislocations. Ligaments Noncontributory Muscles and Tendons Mild diffuse intramuscular edema of the forefoot consistent with myositis. No muscle atrophy or mass. No evidence of tenosynovitis. The extensor and flexor tendons crossing the forefoot of maintains signal intensity and morphology. No appearing tendon tear. Soft tissues Soft tissue ulceration noted along the plantar aspect of the forefoot at the level of the fifth metatarsal head and neck. Associated soft tissue fluid collection contiguous with the soft tissue ulcer measuring 2.5 x 1.9 x 2.9 cm consistent with an abscess. Diffuse cellulitis and myositis of the adjacent forefoot without intramuscular abscess identified IMPRESSION: 1. Soft tissue ulceration along the plantar aspect of the forefoot adjacent to the head of the fifth metatarsal with slightly lobular subcutaneous 2.5 x 1.9 x 2.3 cm fluid collection associated with the ulcer. Suspect soft tissue abscess though study is limited for that assessment given lack of IV contrast. 2. No marrow signal abnormality to indicate osteomyelitis, fracture or bone destruction. 3. Edema of the subcutaneous soft tissues and forefoot musculature consistent with cellulitis and/or myositis. Electronically Signed   By: Tollie Eth M.D.   On: 12/27/2016 15:03        Scheduled Meds: . amLODipine  5 mg Oral Daily  .  Chlorhexidine Gluconate Cloth  6 each Topical Daily  . docusate sodium  100 mg Oral BID  . enoxaparin (LOVENOX) injection  40 mg Subcutaneous QHS  . feeding supplement (PRO-STAT SUGAR FREE 64)  30 mL Oral BID  . insulin aspart  0-15 Units Subcutaneous TID WC  . insulin aspart  0-5  Units Subcutaneous QHS  . insulin glargine  25 Units Subcutaneous QHS  . lisinopril  10 mg Oral Daily  . mupirocin ointment  1 application Nasal BID   Continuous Infusions: . sodium chloride 75 mL/hr at 12/28/16 0104  . ferumoxytol Stopped (12/26/16 2300)  . vancomycin       LOS: 4 days    Time spent: over 30 minutes    Lacretia Nicks, MD [facility] Pager [phone]  If 7PM-7AM, please contact night-coverage [email or web address] Password [email or web address] 12/29/2016, 1:32 PM

## 2016-12-30 LAB — BASIC METABOLIC PANEL
ANION GAP: 6 (ref 5–15)
BUN: 23 mg/dL — ABNORMAL HIGH (ref 6–20)
CO2: 26 mmol/L (ref 22–32)
Calcium: 7.9 mg/dL — ABNORMAL LOW (ref 8.9–10.3)
Chloride: 107 mmol/L (ref 101–111)
Creatinine, Ser: 1.19 mg/dL — ABNORMAL HIGH (ref 0.44–1.00)
GFR calc Af Amer: >60 mL/min (ref >60)
GFR, EST NON AFRICAN AMERICAN: 60 mL/min — AB (ref >60)
GLUCOSE: 177 mg/dL — AB (ref 65–99)
POTASSIUM: 3.8 mmol/L (ref 3.5–5.1)
Sodium: 139 mmol/L (ref 135–145)

## 2016-12-30 LAB — GLUCOSE, CAPILLARY
GLUCOSE-CAPILLARY: 143 mg/dL — AB (ref 65–99)
Glucose-Capillary: 167 mg/dL — ABNORMAL HIGH (ref 65–99)
Glucose-Capillary: 166 mg/dL — ABNORMAL HIGH (ref 65–99)
Glucose-Capillary: 184 mg/dL — ABNORMAL HIGH (ref 65–99)

## 2016-12-30 LAB — URINALYSIS, ROUTINE W REFLEX MICROSCOPIC
Bilirubin Urine: NEGATIVE
GLUCOSE, UA: NEGATIVE mg/dL
Ketones, ur: NEGATIVE mg/dL
Nitrite: NEGATIVE
Protein, ur: 100 mg/dL — AB
SPECIFIC GRAVITY, URINE: 1.009 (ref 1.005–1.030)
pH: 5 (ref 5.0–8.0)

## 2016-12-30 LAB — ALBUMIN: ALBUMIN: 1.9 g/dL — AB (ref 3.5–5.0)

## 2016-12-30 MED ORDER — SODIUM CHLORIDE 0.9 % IV BOLUS (SEPSIS)
1000.0000 mL | Freq: Once | INTRAVENOUS | Status: AC
  Administered 2016-12-30: 1000 mL via INTRAVENOUS

## 2016-12-30 NOTE — Progress Notes (Signed)
PROGRESS NOTE    Victoria Everett  WUJ:[id number] DOB: 03-Oct-1985 DOA: 12/25/2016 PCP: System, Pcp Not In    Brief Narrative:  Victoria Everett Victoria Everett 31 y.o.female was recently admitted in early October with left foot ulcer and cellulitis at that time was diagnosed with new onset diabetes with hemoglobin A1c of 13.3, she was treated with antibiotics, started on insulin, MRI foot showed no evidence of osteomyelitis, discharged home on oral antibiotics subsequently followed up at the wound center on 10/17 where the ulcer was felt to be healing well,  And she had ABIs done which were normal, subsequently developed increasing foot pain since Monday and generalized body aches hence presented to the emergency room. Noted to have increased pain and swelling of the left lower leg with mild erythema on the dorsum of her foot and chronic healing ulcer at the base of the first metatarsal. Started Abx, 11/5 increased fluctuation and drainage, MRI with small abscess, Ortho Dr.Norris consulted, s/p I&D and drain  Assessment & Plan:   Active Problems:   Cellulitis of foot, left   Normochromic normocytic anemia   Sepsis (HCC)   DM2 (diabetes mellitus, type 2) (HCC)   Abscess of left foot   1. Diabetic foot ulcer with cellulitis and small abscess -Admitted with increased pain, swelling of LLE and low-grade fevers and chills -11/5 with increase fluctuance and increased drainage from known ulcer site, MRI noted small abscess -continue IV Vanc , MRI noted soft tissue swelling and small abscess -greatly appreciate Ortho consult per Dr.Norris, s/p I&D and penrose drains and deep cultures -FU cultures -> with GBS and staph aureus  2. Uncontrolled type 2 diabetes, recent diagnosis last month -Last hemoglobin A1c was 13.3 -insulin 7030 was switched to Lantus on admission, CBGs stable -hba1c is 10 now -CBGs stable  3. AKI: Baseline Cr less than 1.  Slightly improved today.  UA as below.  [name] receiving  vancomycin.  Also receiving lisinopril which we'll hold for now.  FeNa prerenal, continuing IVF  4. Pyuria  Hematuria  Proteinuria:  UA with squams, suggesting contaminant, but also with 100 mg/dl protein, hematuria, and WBC's.  Will repeat and check urine cx as well, though pt asymptomatic.   3. Anemia secondary to chronic disease and iron deficiency -Anemia panel last month with iron deficiency noted -Now with worsened due to hemodilution, given IV Iron 11/4,  -suspect secondary to menstrual blood loss and acute illness with infections -monitor CBC, will need Iron at DC  4. Hypertension -Holding lisinopril with above.   5. C/o blurry vision, wears contacts.  Recommended outpatient f/u for eye exam.  D/c planning, [place] [facility] and would like to go there after discharge.   DVT prophylaxis: SCD Code Status: full  Family Communication: girlfriend at bedside Disposition Plan:pending improement   Consultants:   ortho  Procedures: (Don't include imaging studies which can be auto populated. Include things that cannot be auto populated i.e. Echo, Carotid and venous dopplers, Foley, Bipap, HD, tubes/drains, wound vac, central lines etc)  LE US Final Interpretation Left  There is no evidence of deep vein thrombosis in the lower extremity. No superficial venous thrombosis Enlarged lymph nodes visualized in the groin.  S/p irrigation and debridement of L foot by ortho    Antimicrobials: (specify start and planned stop date. Auto populated tables are space occupying and do not give end dates) Anti-infectives (From admission, onward)   Start     Dose/Rate Route Frequency Ordered Stop   12/29/16 1800  vancomycin (VANCOCIN) 1,500 mg in sodium chloride 0.9 % 500 mL IVPB     1,500 mg 250 mL/hr over 120 Minutes Intravenous Every 24 hours 12/29/16 1020     12/28/16 2000  vancomycin (VANCOCIN) IVPB 1000 mg/200 mL premix  Status:  Discontinued     1,000 mg 200 mL/hr over 60 Minutes  Intravenous Every 12 hours 12/28/16 1137 12/29/16 0711   12/28/16 0600  clindamycin (CLEOCIN) IVPB 900 mg  Status:  Discontinued     900 mg 100 mL/hr over 30 Minutes Intravenous On call to O.R. 12/27/16 1812 12/27/16 2343   12/26/16 0800  vancomycin (VANCOCIN) 1,250 mg in sodium chloride 0.9 % 250 mL IVPB  Status:  Discontinued     1,250 mg 166.7 mL/hr over 90 Minutes Intravenous Every 12 hours 12/25/16 2336 12/28/16 1137   12/26/16 0200  piperacillin-tazobactam (ZOSYN) IVPB 3.375 g  Status:  Discontinued     3.375 g 12.5 mL/hr over 240 Minutes Intravenous Every 8 hours 12/25/16 2314 12/26/16 1231   12/25/16 2315  vancomycin (VANCOCIN) IVPB 1000 mg/200 mL premix     1,000 mg 200 mL/hr over 60 Minutes Intravenous NOW 12/25/16 2314 12/26/16 0027   12/25/16 1745  piperacillin-tazobactam (ZOSYN) IVPB 3.375 g     3.375 g 100 mL/hr over 30 Minutes Intravenous  Once 12/25/16 1738 12/25/16 1940   12/25/16 1745  vancomycin (VANCOCIN) IVPB 1000 mg/200 mL premix     1,000 mg 200 mL/hr over 60 Minutes Intravenous  Once 12/25/16 1738 12/25/16 1950         Subjective: Doing ok.   Asking when she might be able to go. No dysuria or UTI sx.   Objective: Vitals:   12/29/16 0558 12/29/16 1338 12/29/16 2013 12/30/16 0522  BP: 140/78 (!) 141/84 (!) 144/82 (!) 145/84  Pulse: 95 (!) 109 (!) 105 97  Resp: 18 18 19 18   Temp: 99.1 F (37.3 C) 99.5 F (37.5 C) 100.1 F (37.8 C) 100 F (37.8 C)  TempSrc: Oral Oral Oral Oral  SpO2: 98% 100% 97% 95%  Weight:      Height:        Intake/Output Summary (Last 24 hours) at 12/30/2016 1341 Last data filed at 12/30/2016 0915 Gross per 24 hour  Intake 1456.25 ml  Output -  Net 1456.25 ml   Filed Weights   12/25/16 1757 12/25/16 2307  Weight: 110.2 kg (243 lb) 110 kg (242 lb 8.1 oz)    Examination:  General: No acute distress. Cardiovascular: Heart sounds show Victoria Everett regular rate, and rhythm. No gallops or rubs. No murmurs. No JVD. Lungs: Clear to  auscultation bilaterally with good air movement. No rales, rhonchi or wheezes. Abdomen: Soft, nontender, nondistended with normal active bowel sounds. No masses. No hepatosplenomegaly. Neurological: Alert and oriented 3. Moves all extremities 4 with equal strength. Cranial nerves II through XII grossly intact. Skin: LLE with well healing scar.  Erythema and induration Psychiatric: Mood and affect are normal. Insight and judgment are appropriate.    Data Reviewed: I have personally reviewed following labs and imaging studies  CBC: Recent Labs  Lab 12/25/16 1750 12/26/16 0646 12/27/16 0614 12/28/16 0630 12/29/16 0544  WBC 11.7* 9.8 8.6 11.6* 8.1  NEUTROABS 9.7*  --   --   --   --   HGB 9.5* 8.1* 7.9* 8.3* 7.5*  HCT 30.0* 25.5* 24.7* 26.3* 23.7*  MCV 81.1 81.2 80.7 80.4 80.6  PLT 275 223 226 264 259   Basic Metabolic Panel: Recent  Labs  Lab 12/26/16 0646 12/27/16 0614 12/28/16 0630 12/29/16 0544 12/30/16 0552  NA 136 138 137 135 139  K 3.3* 3.9 3.6 3.7 3.8  CL 105 107 102 102 107  CO2 23 25 24 25 26   GLUCOSE 103* 111* 119* 147* 177*  BUN 12 14 11  24* 23*  CREATININE 0.82 0.95 0.99 1.26* 1.19*  CALCIUM 7.6* 8.0* 8.1* 7.9* 7.9*  MG 1.6*  --   --   --   --   PHOS 2.3*  --   --   --   --    GFR: Estimated Creatinine Clearance: 92.7 mL/min (Oluwatosin Higginson) (by C-G formula based on SCr of 1.19 mg/dL (H)). Liver Function Tests: Recent Labs  Lab 12/25/16 1750 12/26/16 0646  AST 20 23  ALT 13* 15  ALKPHOS 68 77  BILITOT 0.7 0.8  PROT 7.5 5.9*  ALBUMIN 3.1* 2.4*   No results for input(s): LIPASE, AMYLASE in the last 168 hours. No results for input(s): AMMONIA in the last 168 hours. Coagulation Profile: No results for input(s): INR, PROTIME in the last 168 hours. Cardiac Enzymes: No results for input(s): CKTOTAL, CKMB, CKMBINDEX, TROPONINI in the last 168 hours. BNP (last 3 results) No results for input(s): PROBNP in the last 8760 hours. HbA1C: No results for input(s):  HGBA1C in the last 72 hours. CBG: Recent Labs  Lab 12/29/16 1106 12/29/16 1646 12/29/16 2210 12/30/16 0731 12/30/16 1133  GLUCAP 157* 152* 186* 143* 167*   Lipid Profile: No results for input(s): CHOL, HDL, LDLCALC, TRIG, CHOLHDL, LDLDIRECT in the last 72 hours. Thyroid Function Tests: No results for input(s): TSH, T4TOTAL, FREET4, T3FREE, THYROIDAB in the last 72 hours. Anemia Panel: No results for input(s): VITAMINB12, FOLATE, FERRITIN, TIBC, IRON, RETICCTPCT in the last 72 hours. Sepsis Labs: Recent Labs  Lab 12/25/16 1756 12/25/16 1942 12/25/16 2346  PROCALCITON  --   --  <0.10  LATICACIDVEN 0.94 1.11  --     Recent Results (from the past 240 hour(s))  Blood Culture (routine x 2)     Status: None (Preliminary result)   Collection Time: 12/25/16  5:34 PM  Result Value Ref Range Status   Specimen Description BLOOD LEFT WRIST  Final   Special Requests   Final    BOTTLES DRAWN AEROBIC AND ANAEROBIC Blood Culture adequate volume   Culture   Final    NO GROWTH 4 DAYS Performed at [facility], [address], [place], [place] [address]    Report Status PENDING  Incomplete  MRSA PCR Screening     Status: None   Collection Time: 12/25/16  9:26 PM  Result Value Ref Range Status   MRSA by PCR NEGATIVE NEGATIVE Final    Comment:        The GeneXpert MRSA Assay (FDA approved for NASAL specimens only), is one component of Lynne Righi comprehensive MRSA colonization surveillance program. It is not intended to diagnose MRSA infection nor to guide or monitor treatment for MRSA infections.   Respiratory Panel by PCR     Status: None   Collection Time: 12/25/16 10:42 PM  Result Value Ref Range Status   Adenovirus NOT DETECTED NOT DETECTED Final   Coronavirus 229E NOT DETECTED NOT DETECTED Final   Coronavirus HKU1 NOT DETECTED NOT DETECTED Final   Coronavirus NL63 NOT DETECTED NOT DETECTED Final   Coronavirus OC43 NOT DETECTED NOT DETECTED Final   Metapneumovirus NOT  DETECTED NOT DETECTED Final   Rhinovirus / Enterovirus NOT DETECTED NOT DETECTED  Final   Influenza [place] NOT DETECTED NOT DETECTED Final   Influenza B NOT DETECTED NOT DETECTED Final   Parainfluenza Virus 1 NOT DETECTED NOT DETECTED Final   Parainfluenza Virus 2 NOT DETECTED NOT DETECTED Final   Parainfluenza Virus 3 NOT DETECTED NOT DETECTED Final   Parainfluenza Virus 4 NOT DETECTED NOT DETECTED Final   Respiratory Syncytial Virus NOT DETECTED NOT DETECTED Final   Bordetella pertussis NOT DETECTED NOT DETECTED Final   Chlamydophila pneumoniae NOT DETECTED NOT DETECTED Final   Mycoplasma pneumoniae NOT DETECTED NOT DETECTED Final    Comment: Performed at [facility], [address] [place], [place] [address]  Blood Culture (routine x 2)     Status: None (Preliminary result)   Collection Time: 12/25/16 11:46 PM  Result Value Ref Range Status   Specimen Description BLOOD RIGHT HAND  Final   Special Requests   Final    BOTTLES DRAWN AEROBIC ONLY Blood Culture adequate volume   Culture   Final    NO GROWTH 4 DAYS Performed at [facility], [address] [place], [place] [address]    Report Status PENDING  Incomplete  Surgical pcr screen     Status: Abnormal   Collection Time: 12/27/16  6:45 PM  Result Value Ref Range Status   MRSA, PCR NEGATIVE NEGATIVE Final   Staphylococcus aureus POSITIVE (Ronique Simerly) NEGATIVE Final    Comment: (NOTE) The Xpert SA Assay (FDA approved for NASAL specimens in patients 32 years of age and older), is one component of Cornesha Radziewicz comprehensive surveillance program. It is not intended to diagnose infection nor to guide or monitor treatment.   Aerobic/Anaerobic Culture (surgical/deep wound)     Status: None (Preliminary result)   Collection Time: 12/27/16 10:31 PM  Result Value Ref Range Status   Specimen Description ABSCESS  Final   Special Requests NONE  Final   Gram Stain   Final    FEW WBC PRESENT, PREDOMINANTLY PMN NO SQUAMOUS EPITHELIAL CELLS  SEEN FEW GRAM POSITIVE COCCI IN CLUSTERS    Culture   Final    CULTURE REINCUBATED FOR BETTER GROWTH Performed at [facility], [address] [place], [place] [address]    Report Status PENDING  Incomplete         Radiology Studies: No results found.      Scheduled Meds: . amLODipine  5 mg Oral Daily  . Chlorhexidine Gluconate Cloth  6 each Topical Daily  . docusate sodium  100 mg Oral BID  . enoxaparin (LOVENOX) injection  40 mg Subcutaneous QHS  . feeding supplement (PRO-STAT SUGAR FREE 64)  30 mL Oral BID  . insulin aspart  0-15 Units Subcutaneous TID WC  . insulin aspart  0-5 Units Subcutaneous QHS  . insulin glargine  25 Units Subcutaneous QHS  . mupirocin ointment  1 application Nasal BID   Continuous Infusions: . sodium chloride 125 mL/hr at 12/30/16 0915  . ferumoxytol Stopped (12/26/16 2300)  . vancomycin Stopped (12/29/16 2329)     LOS: 5 days    Time spent: over 30 minutes    Lacretia Nicksaldwell Powell, MD [facility] Pager [phone]  If 7PM-7AM, please contact night-coverage [email or web address] Password [email or web address] 12/30/2016, 1:41 PM

## 2016-12-30 NOTE — Progress Notes (Signed)
   Subjective: 3 Days Post-Op Procedure(s) (LRB): IRRIGATION AND DEBRIDEMENT LEFT FOOT (Left)  Mild pain in the left foot but overall much better than prior to admission Denies any new symptoms or issues  Patient reports pain as mild.  Objective:   VITALS:   Vitals:   12/30/16 0522 12/30/16 1350  BP: (!) 145/84 (!) 166/92  Pulse: 97 100  Resp: 18 18  Temp: 100 F (37.8 C) 98.9 F (37.2 C)  SpO2: 95% 97%    Left foot plantar and dorsal incisions healing well  No drainage or erythema nv intact distally  LABS Recent Labs    12/28/16 0630 12/29/16 0544  HGB 8.3* 7.5*  HCT 26.3* 23.7*  WBC 11.6* 8.1  PLT 264 259    Recent Labs    12/28/16 0630 12/29/16 0544 12/30/16 0552  NA 137 135 139  K 3.6 3.7 3.8  BUN 11 24* 23*  CREATININE 0.99 1.26* 1.19*  GLUCOSE 119* 147* 177*     Assessment/Plan: 3 Days Post-Op Procedure(s) (LRB): IRRIGATION AND DEBRIDEMENT LEFT FOOT (Left) Pt stable from orthopedic standpoint  Plan to f/u in 10 days in the office after discharge Recommend oral antibiotics to treat staph once sensitivities come back Continue IV vanc until cultures are back Will continue to monitor her progress while she is in the hospital     [facility] LLCBrad Ednamae Schiano, MPAS, PA-C  12/30/2016, 2:10 PM

## 2016-12-31 ENCOUNTER — Inpatient Hospital Stay (HOSPITAL_COMMUNITY): Payer: Self-pay

## 2016-12-31 DIAGNOSIS — L03116 Cellulitis of left lower limb: Secondary | ICD-10-CM

## 2016-12-31 DIAGNOSIS — A419 Sepsis, unspecified organism: Principal | ICD-10-CM

## 2016-12-31 DIAGNOSIS — Z794 Long term (current) use of insulin: Secondary | ICD-10-CM

## 2016-12-31 DIAGNOSIS — L02612 Cutaneous abscess of left foot: Secondary | ICD-10-CM

## 2016-12-31 DIAGNOSIS — L97509 Non-pressure chronic ulcer of other part of unspecified foot with unspecified severity: Secondary | ICD-10-CM

## 2016-12-31 DIAGNOSIS — E11621 Type 2 diabetes mellitus with foot ulcer: Secondary | ICD-10-CM

## 2016-12-31 LAB — CBC
HCT: 25.2 % — ABNORMAL LOW (ref 36.0–46.0)
HEMOGLOBIN: 7.8 g/dL — AB (ref 12.0–15.0)
MCH: 25.1 pg — AB (ref 26.0–34.0)
MCHC: 31 g/dL (ref 30.0–36.0)
MCV: 81 fL (ref 78.0–100.0)
PLATELETS: 305 10*3/uL (ref 150–400)
RBC: 3.11 MIL/uL — ABNORMAL LOW (ref 3.87–5.11)
RDW: 12.8 % (ref 11.5–15.5)
WBC: 8.5 10*3/uL (ref 4.0–10.5)

## 2016-12-31 LAB — BASIC METABOLIC PANEL
Anion gap: 8 (ref 5–15)
BUN: 17 mg/dL (ref 6–20)
CHLORIDE: 108 mmol/L (ref 101–111)
CO2: 23 mmol/L (ref 22–32)
CREATININE: 1.05 mg/dL — AB (ref 0.44–1.00)
Calcium: 7.8 mg/dL — ABNORMAL LOW (ref 8.9–10.3)
GFR calc Af Amer: >60 mL/min (ref >60)
GFR calc non Af Amer: >60 mL/min (ref >60)
GLUCOSE: 107 mg/dL — AB (ref 65–99)
POTASSIUM: 3.8 mmol/L (ref 3.5–5.1)
SODIUM: 139 mmol/L (ref 135–145)

## 2016-12-31 LAB — CULTURE, BLOOD (ROUTINE X 2)
CULTURE: NO GROWTH
Culture: NO GROWTH
SPECIAL REQUESTS: ADEQUATE
Special Requests: ADEQUATE

## 2016-12-31 LAB — GLUCOSE, CAPILLARY
GLUCOSE-CAPILLARY: 108 mg/dL — AB (ref 65–99)
Glucose-Capillary: 139 mg/dL — ABNORMAL HIGH (ref 65–99)
Glucose-Capillary: 131 mg/dL — ABNORMAL HIGH (ref 65–99)
Glucose-Capillary: 140 mg/dL — ABNORMAL HIGH (ref 65–99)

## 2016-12-31 MED ORDER — GUAIFENESIN-DM 100-10 MG/5ML PO SYRP
5.0000 mL | ORAL_SOLUTION | ORAL | Status: DC | PRN
  Administered 2016-12-31: 5 mL via ORAL
  Filled 2016-12-31: qty 10

## 2016-12-31 MED ORDER — AMLODIPINE BESYLATE 5 MG PO TABS: 5.0000 mg | ORAL_TABLET | Freq: Every day | ORAL | 0 refills | Status: AC

## 2016-12-31 MED ORDER — OXYCODONE HCL 5 MG PO TABS: 5.0000 mg | ORAL_TABLET | Freq: Four times a day (QID) | ORAL | 0 refills | Status: AC | PRN

## 2016-12-31 MED ORDER — AMOXICILLIN 500 MG PO TABS: 500.0000 mg | ORAL_TABLET | Freq: Two times a day (BID) | ORAL | 0 refills | Status: AC

## 2016-12-31 MED ORDER — SULFAMETHOXAZOLE-TRIMETHOPRIM 800-160 MG PO TABS: 1.0000 | ORAL_TABLET | Freq: Two times a day (BID) | ORAL | 0 refills | Status: AC

## 2016-12-31 MED ORDER — FERROUS SULFATE 325 (65 FE) MG PO TABS: 325.0000 mg | ORAL_TABLET | Freq: Two times a day (BID) | ORAL | 0 refills | Status: AC

## 2016-12-31 NOTE — Discharge Summary (Signed)
Physician Discharge Summary  [name] WCH:[id number] DOB: 1985/08/28 DOA: 12/25/2016  PCP: Brayton Caves, PA-C  Admit date: 12/25/2016 Discharge date: 12/31/2016  Time spent: over 30 minutes  Recommendations for Outpatient Follow-up:  1. Follow up outpatient CBC/CMP.  Response to iron.  2. Ensure follow up with ortho as planned.  Suture removal.   3. Discharged on amoxicillin and bactrim.  Follow up final cultures (being held for possible anaerobe). 4. Follow up outpatient UA (pt had proteinuria, hematuria here, but no UTI sx - urine cx ordered, but not done).  Follow up renal function. 5. Follow up BG control  6. Ensure outpatient ophtho follow up with blurry vision  Discharge Diagnoses:  Active Problems:   Cellulitis of foot, left   Normochromic normocytic anemia   Sepsis (HCC)   DM2 (diabetes mellitus, type 2) (HCC)   Abscess of left foot   Discharge Condition: stable  Diet recommendation: heart healthy  Filed Weights   12/25/16 1757 12/25/16 2307  Weight: 110.2 kg (243 lb) 110 kg (242 lb 8.1 oz)    History of present illness:  Victoria Everett a 31 y.o.female was recently admitted in early October with left foot ulcer and cellulitis at that time was diagnosed with new onset diabetes with hemoglobin A1c of 13.3, she was treated with antibiotics, started on insulin, MRI foot showed no evidence of osteomyelitis, discharged home on oral antibiotics subsequently followed up at the wound center on 10/17 where the ulcer was felt to be healing well, And she had ABIs done which were normal, subsequently developed increasing foot pain since Monday and generalized body aches hence presented to the emergency room. Noted to have increased pain and swelling of the left lower leg with mild erythema on the dorsum of her foot and chronic healing ulcer at the base of the first metatarsal. Started Abx, 11/5 increased fluctuation and drainage, MRI with small abscess, Ortho Dr.Norris  consulted, s/p I&D and drain  Hospital Course:  1. Diabetic foot ulcer with cellulitisand small abscess -Admitted with increased pain, swelling of LLE and low-grade fevers and chills -11/5with increase fluctuance and increaseddrainage from known ulcer site, MRI noted small abscess - Treated with vanc while inpatient.  MRI noted soft tissue swelling and small abscess - Discharged with bactrim and amoxicillin.  - s/p I&D by Dr. Veverly Fells on 11/5 - FU cultures -> with GBS and staph aureus  2. Sprained ankle  Possible Cortical Irregularity along base of 5th proximal Phalanx D/c with ASO Ankle films without abnormality For possible toe fracture, discussed with ortho, f/u with ortho, continue boot/dressing as above   2. Uncontrolled type 2 diabetes, recent diagnosis last month -Last hemoglobin A1c was 13.3 -insulin 7030 was switched to Lantus on admission, CBGs stable (dc'd on home regimen) -hba1c is 10 now -CBGs stable  3. AKI: Baseline Cr less than 1.  Improved on discharge to 1.05.  UA as below.  [name] receiving vancomycin.  Also receiving lisinopril which we'll hold for now.  FeNa prerenal, continuing IVF  4. Pyuria  Hematuria  Proteinuria:  UA with squams, suggesting contaminant, but also with 100 mg/dl protein, hematuria, and WBC's.  Will repeat and check urine cx as well, though pt asymptomatic.   3. Anemia secondary to chronic disease and iron deficiency -Anemia panel last month with iron deficiency noted -Now with worsened due to hemodilution, given IV Iron 11/4,  -suspect secondary to menstrual blood lossand acute illness with infections -monitor CBC, will need Iron at DC  4. Hypertension -Holding lisinopril with above, start amlodipine.  5. C/o blurry vision, wears contacts.  Recommended outpatient f/u for eye exam.  D/c planning, is from [facility] and would like to go there after discharge.   Procedures: 11/5 I&D by ortho  LE [place] Final  Interpretation Left  There is no evidence of deep vein thrombosis in the lower extremity. No superficial venous thrombosis Enlarged lymph nodes visualized in the groin.  Consultations:  orthopedics  Discharge Exam: Vitals:   12/31/16 1416 12/31/16 2127  BP: 135/87 (!) 153/96  Pulse: (!) 107 (!) 104  Resp: 17 18  Temp: 98.2 F (36.8 C) 99.7 F (37.6 C)  SpO2: 100% 96%    General: No acute distress. Cardiovascular: Heart sounds show a regular rate, and rhythm. No gallops or rubs. No murmurs. No JVD. Lungs: Clear to auscultation bilaterally with good air movement. No rales, rhonchi or wheezes. Abdomen: Soft, nontender, nondistended with normal active bowel sounds. No masses. No hepatosplenomegaly. Neurological: Alert and oriented 3. Moves all extremities 4 with equal strength. Cranial nerves II through XII grossly intact. Extremities: Well healing incisions on dorsum and plantar surface of L foot.  Surrounding erythema is mild, swelling present.  TTP of L ankle, tenderness with inversion. Psychiatric: Mood and affect are normal. Insight and judgment are appropriate.  Discharge Instructions   Discharge Instructions    Diet - low sodium heart healthy   Complete by:  As directed    Discharge instructions   Complete by:  As directed    You were seen for an abscess of your foot.  This was treated by drainage by ortho.  Please call to schedule an appointment with them next week.  You should take amoxicillin 500 mg twice daily and bactrim twice daily for 10 days (these should be about 20-30 dollars at [employer]).  You should start these tomorrow, the IV antibiotic will cover you until then.    I think you sprained your ankle.  We'll put you in an ankle brace and have you follow up with ortho.  You also may have a fracture of your small toe on that left foot.  You can follow up with ortho for this as well.  Wear the boot provided to help protect the foot.  Change the dressing daily.    Your kidney function looks better, please continue to hold the lisinopril and HCTZ.  Continue your amlodipine.  Watch your blood pressure and if it's consistently over 140/90, please let your primary care doctor know.  You had blood and protein in your urine.  This should be followed up and repeated as an outpatient.    Please follow up with a eye doctor.   Please follow up with a primary care provider within 1 week.  Have your home PCP request your discharge summary so they know what has been done and the planned next steps.   Increase activity slowly   Complete by:  As directed      Current Discharge Medication List    START taking these medications   Details  amoxicillin (AMOXIL) 500 MG tablet Take 1 tablet (500 mg total) 2 (two) times daily for 10 days by mouth. Qty: 20 tablet, Refills: 0    ferrous sulfate 325 (65 FE) MG tablet Take 1 tablet (325 mg total) 2 (two) times daily with a meal by mouth. Qty: 30 tablet, Refills: 0    oxyCODONE (ROXICODONE) 5 MG immediate release tablet Take 1 tablet (5 mg total) every  6 (six) hours as needed by mouth for severe pain. Qty: 12 tablet, Refills: 0    sulfamethoxazole-trimethoprim (BACTRIM DS,SEPTRA DS) 800-160 MG tablet Take 1 tablet 2 (two) times daily for 10 days by mouth. Qty: 20 tablet, Refills: 0      CONTINUE these medications which have CHANGED   Details  amLODipine (NORVASC) 5 MG tablet Take 1 tablet (5 mg total) daily by mouth. Qty: 30 tablet, Refills: 0      CONTINUE these medications which have NOT CHANGED   Details  acetaminophen (TYLENOL) 500 MG tablet Take 1,000 mg by mouth every 6 (six) hours as needed for mild pain or headache.    ibuprofen (ADVIL,MOTRIN) 200 MG tablet Take 400 mg by mouth every 6 (six) hours as needed for headache or moderate pain.    insulin aspart protamine- aspart (NOVOLOG MIX 70/30) (70-30) 100 UNIT/ML injection Inject 0.3 mLs (30 Units total) into the skin 2 (two) times daily with a  meal. Qty: 10 mL, Refills: 3    blood glucose meter kit and supplies KIT Dispense based on patient and insurance preference. Use up to four times daily as directed. (FOR ICD-9 250.00, 250.01). Qty: 1 each, Refills: 3    HYDROcodone-acetaminophen (NORCO/VICODIN) 5-325 MG tablet Take 1 tablet by mouth every 4 (four) hours as needed for severe pain. Qty: 15 tablet, Refills: 0    Insulin Syringe-Needle U-100 30G 0.5 ML MISC Use as directed Qty: 100 each, Refills: 2    metFORMIN (GLUCOPHAGE) 500 MG tablet Take 1 tablet (500 mg total) by mouth 2 (two) times daily with a meal. Qty: 30 tablet, Refills: 0      STOP taking these medications     lisinopril-hydrochlorothiazide (PRINZIDE,ZESTORETIC) 10-12.5 MG tablet      cephALEXin (KEFLEX) 500 MG capsule        No Known Allergies    The results of significant diagnostics from this hospitalization (including imaging, microbiology, ancillary and laboratory) are listed below for reference.    Significant Diagnostic Studies: Dg Ankle Complete Left  Result Date: 12/31/2016 CLINICAL DATA:  Twisting injury while walking EXAM: LEFT ANKLE COMPLETE - 3+ VIEW COMPARISON:  None. FINDINGS: There is no evidence of fracture, dislocation, or joint effusion. There is no evidence of arthropathy or other focal bone abnormality. There is severe soft tissue swelling around the ankle. IMPRESSION: No acute osseous injury of the left ankle. Electronically Signed   By: Kathreen Devoid   On: 12/31/2016 12:24   Mr Foot Left Wo Contrast  Result Date: 12/27/2016 CLINICAL DATA:  Worsening left foot pain.  Diabetes. EXAM: MRI OF THE LEFT FOOT WITHOUT CONTRAST TECHNIQUE: Multiplanar, multisequence MR imaging of the left forefoot was performed. No intravenous contrast was administered. COMPARISON:  Radiographs from 12/25/2016 FINDINGS: Bones/Joint/Cartilage No marrow signal abnormality to indicate osteomyelitis or fracture. No joint dislocations. Ligaments Noncontributory  Muscles and Tendons Mild diffuse intramuscular edema of the forefoot consistent with myositis. No muscle atrophy or mass. No evidence of tenosynovitis. The extensor and flexor tendons crossing the forefoot of maintains signal intensity and morphology. No appearing tendon tear. Soft tissues Soft tissue ulceration noted along the plantar aspect of the forefoot at the level of the fifth metatarsal head and neck. Associated soft tissue fluid collection contiguous with the soft tissue ulcer measuring 2.5 x 1.9 x 2.9 cm consistent with an abscess. Diffuse cellulitis and myositis of the adjacent forefoot without intramuscular abscess identified IMPRESSION: 1. Soft tissue ulceration along the plantar aspect of the forefoot adjacent  to the head of the fifth metatarsal with slightly lobular subcutaneous 2.5 x 1.9 x 2.3 cm fluid collection associated with the ulcer. Suspect soft tissue abscess though study is limited for that assessment given lack of IV contrast. 2. No marrow signal abnormality to indicate osteomyelitis, fracture or bone destruction. 3. Edema of the subcutaneous soft tissues and forefoot musculature consistent with cellulitis and/or myositis. Electronically Signed   By: Ashley Royalty M.D.   On: 12/27/2016 15:03   Dg Foot 2 Views Left  Result Date: 12/31/2016 CLINICAL DATA:  Twisting injury of the foot. Lateral ankle and foot pain. EXAM: LEFT FOOT - 2 VIEW COMPARISON:  None. FINDINGS: Mild cortical irregularity along the medial base of the fifth proximal phalanx which may be projectional versus a nondisplaced fracture. No other fracture or dislocation. No periosteal reaction or bone destruction. Soft tissue swelling around the dorsal aspect of the foot. IMPRESSION: 1. Mild cortical irregularity along the medial base of the fifth proximal phalanx which may be projectional versus a nondisplaced fracture. Electronically Signed   By: Kathreen Devoid   On: 12/31/2016 12:25   Dg Foot Complete Left  Result Date:  12/25/2016 CLINICAL DATA:  31 year old female with left foot pain and ulcer along the plantar surface for 1 month. EXAM: LEFT FOOT - COMPLETE 3+ VIEW COMPARISON:  Radiographs 11/21/2016 and MRI 11/25/2016. FINDINGS: There is no evidence of fracture or dislocation. There is no evidence of arthropathy or other focal bone abnormality. Soft tissue swelling is noted about the distal metatarsal head. IMPRESSION: Soft tissue swelling about the fifth metatarsal head without underlying osseous abnormality. Please note, plain films have limited sensitivity for detection of early osteomyelitis. Electronically Signed   By: Kristopher Oppenheim M.D.   On: 12/25/2016 18:28    Microbiology: Recent Results (from the past 240 hour(s))  Blood Culture (routine x 2)     Status: None   Collection Time: 12/25/16  5:34 PM  Result Value Ref Range Status   Specimen Description BLOOD LEFT WRIST  Final   Special Requests   Final    BOTTLES DRAWN AEROBIC AND ANAEROBIC Blood Culture adequate volume   Culture   Final    NO GROWTH 5 DAYS Performed at [facility], [address], [place], [place] [address]    Report Status 12/31/2016 FINAL  Final  MRSA PCR Screening     Status: None   Collection Time: 12/25/16  9:26 PM  Result Value Ref Range Status   MRSA by PCR NEGATIVE NEGATIVE Final    Comment:        The GeneXpert MRSA Assay (FDA approved for NASAL specimens only), is one component of a comprehensive MRSA colonization surveillance program. It is not intended to diagnose MRSA infection nor to guide or monitor treatment for MRSA infections.   Respiratory Panel by PCR     Status: None   Collection Time: 12/25/16 10:42 PM  Result Value Ref Range Status   Adenovirus NOT DETECTED NOT DETECTED Final   Coronavirus 229E NOT DETECTED NOT DETECTED Final   Coronavirus HKU1 NOT DETECTED NOT DETECTED Final   Coronavirus NL63 NOT DETECTED NOT DETECTED Final   Coronavirus OC43 NOT DETECTED NOT DETECTED Final    Metapneumovirus NOT DETECTED NOT DETECTED Final   Rhinovirus / Enterovirus NOT DETECTED NOT DETECTED Final   Influenza A NOT DETECTED NOT DETECTED Final   Influenza B NOT DETECTED NOT DETECTED Final   Parainfluenza Virus 1 NOT DETECTED NOT DETECTED Final  Parainfluenza Virus 2 NOT DETECTED NOT DETECTED Final   Parainfluenza Virus 3 NOT DETECTED NOT DETECTED Final   Parainfluenza Virus 4 NOT DETECTED NOT DETECTED Final   Respiratory Syncytial Virus NOT DETECTED NOT DETECTED Final   Bordetella pertussis NOT DETECTED NOT DETECTED Final   Chlamydophila pneumoniae NOT DETECTED NOT DETECTED Final   Mycoplasma pneumoniae NOT DETECTED NOT DETECTED Final    Comment: Performed at [facility], [place] [address] [place], [place] [address]  Blood Culture (routine x 2)     Status: None   Collection Time: 12/25/16 11:46 PM  Result Value Ref Range Status   Specimen Description BLOOD RIGHT HAND  Final   Special Requests   Final    BOTTLES DRAWN AEROBIC ONLY Blood Culture adequate volume   Culture   Final    NO GROWTH 5 DAYS Performed at [facility], [place] [address], [place], [place] [address]    Report Status 12/31/2016 FINAL  Final  Surgical pcr screen     Status: Abnormal   Collection Time: 12/27/16  6:45 PM  Result Value Ref Range Status   MRSA, PCR NEGATIVE NEGATIVE Final   Staphylococcus aureus POSITIVE (A) NEGATIVE Final    Comment: (NOTE) The Xpert SA Assay (FDA approved for NASAL specimens in patients 77 years of age and older), is one component of a comprehensive surveillance program. It is not intended to diagnose infection nor to guide or monitor treatment.   Aerobic/Anaerobic Culture (surgical/deep wound)     Status: None (Preliminary result)   Collection Time: 12/27/16 10:31 PM  Result Value Ref Range Status   Specimen Description ABSCESS  Final   Special Requests NONE  Final   Gram Stain   Final    FEW WBC PRESENT, PREDOMINANTLY PMN NO SQUAMOUS EPITHELIAL CELLS  SEEN FEW GRAM POSITIVE COCCI IN CLUSTERS    Culture   Final    MODERATE GROUP B STREP(S.AGALACTIAE)ISOLATED TESTING AGAINST S. AGALACTIAE NOT ROUTINELY PERFORMED DUE TO PREDICTABILITY OF AMP/PEN/VAN SUSCEPTIBILITY. RARE STAPHYLOCOCCUS AUREUS SUSCEPTIBILITIES TO FOLLOW HOLDING FOR POSSIBLE ANAEROBE Performed at [facility], [place] [address], [place], [place] [address]    Report Status PENDING  Incomplete     Labs: Basic Metabolic Panel: Recent Labs  Lab 12/26/16 0646 12/27/16 0614 12/28/16 0630 12/29/16 0544 12/30/16 0552 12/31/16 0613  NA 136 138 137 135 139 139  K 3.3* 3.9 3.6 3.7 3.8 3.8  CL 105 107 102 102 107 108  CO2 _0 GLUCOSE 103* 111* 119* 147* 177* 107*  BUN _1 24* 23* 17  CREATININE 0.82 0.95 0.99 1.26* 1.19* 1.05*  CALCIUM 7.6* 8.0* 8.1* 7.9* 7.9* 7.8*  MG 1.6*  --   --   --   --   --   PHOS 2.3*  --   --   --   --   --    Liver Function Tests: Recent Labs  Lab 12/25/16 1750 12/26/16 0646 12/30/16 0552  AST 20 23  --   ALT 13* 15  --   ALKPHOS 68 77  --   BILITOT 0.7 0.8  --   PROT 7.5 5.9*  --   ALBUMIN 3.1* 2.4* 1.9*   No results for input(s): LIPASE, AMYLASE in the last 168 hours. No results for input(s): AMMONIA in the last 168 hours. CBC: Recent Labs  Lab 12/25/16 1750 12/26/16 0646 12/27/16 0614 12/28/16 0630 12/29/16 0544 12/31/16 0613  WBC 11.7* 9.8  8.6 11.6* 8.1 8.5  NEUTROABS 9.7*  --   --   --   --   --   HGB 9.5* 8.1* 7.9* 8.3* 7.5* 7.8*  HCT 30.0* 25.5* 24.7* 26.3* 23.7* 25.2*  MCV 81.1 81.2 80.7 80.4 80.6 81.0  PLT 275 223 226 264 259 305   Cardiac Enzymes: No results for input(s): CKTOTAL, CKMB, CKMBINDEX, TROPONINI in the last 168 hours. BNP: BNP (last 3 results) No results for input(s): BNP in the last 8760 hours.  ProBNP (last 3 results) No results for input(s): PROBNP in the last 8760 hours.  CBG: Recent Labs  Lab 12/30/16 2128 12/31/16 0740 12/31/16 1203 12/31/16 1658  12/31/16 2124  GLUCAP 184* 108* 139* 131* 140*       Signed:  Fayrene Helper MD.  [facility] 12/31/2016, 10:33 PM

## 2016-12-31 NOTE — Progress Notes (Signed)
Pt discharged home in stable condition. Discharged instructions and medications reviewed with pt; with verbal understanding.

## 2016-12-31 NOTE — Progress Notes (Signed)
This CM spoke with pt about dc plan. Pt declines home health PT at this time. She does not want 3in1 or wheelchair. She only request RW. DME RW order received and [facility] rep alerted of need. Sandford Crazeora Zyasia Halbleib RN,BSN,NCM [phone]

## 2016-12-31 NOTE — Progress Notes (Signed)
Physical Therapy Treatment Patient Details Name: Victoria CullChantel Grimme MRN: [id number] DOB: [birth year] Today's Date: 12/31/2016    History of Present Illness 31 yo female admitted with diabetic foot ulcer with abscess. S/P I&D L foot, penrose drain 12/27/16. Hx of DM, hyperthyroidism    PT Comments    [facility] and instructed on proper application.  Pt able to self apply.  Also instructed pt to wear her tennis shoe on R for a more equal leg length and increased safety.  Assisted to bathroom using walker, very drunken unsteady gait.  Required increased time/practice to WB thru heel and use walker properly.  Practiced 8 stairs.  Pt too unsteady to perform in standing so pt sat on 3 rd step and backward scooted self up a total of 8 steps.  Pt stated "this is how I had to do it before".  Returned to room and assisted back to bed.     Follow Up Recommendations  Home health PT;Supervision/Assistance - 24 hour(pt declines as [facility])     Equipment Recommendations  Rolling walker with 5" wheels;Wheelchair (measurements PT);3in1 (PT)(pt only agreeable to walker)    Recommendations for Other Services       Precautions / Restrictions Precautions Precautions: Fall Required Braces or Orthoses: Other Brace/Splint Other Brace/Splint: DARCO post op shoe L foot Restrictions Weight Bearing Restrictions: No LLE Weight Bearing: Weight bearing as tolerated Other Position/Activity Restrictions: with DARCO shoe. Weight through heel only.     Mobility  Bed Mobility Overal bed mobility: Modified Independent                Transfers Overall transfer level: Needs assistance Equipment used: Rolling walker (2 wheeled) Transfers: Sit to/from Stand Sit to Stand: Supervision;Min guard         General transfer comment: close guard for safety. Cues for hand/L LE placement.   Impulsive  Ambulation/Gait Ambulation/Gait assistance: Supervision;Min  guard Ambulation Distance (Feet): 35 Feet Assistive device: Rolling walker (2 wheeled) Gait Pattern/deviations: Step-to pattern Gait velocity: decreased    General Gait Details: 50% VC's on proper tech using walker and WBing thru L heel with DARCO shoe   Stairs Stairs: Yes   Stair Management: Seated/boosting Number of Stairs: 8 General stair comments: with increased time pt was able to back scoot on buttocks 8 steps.  Pt stated this is how she had to do it before admit.    Wheelchair Mobility    Modified Rankin (Stroke Patients Only)       Balance                                            Cognition Arousal/Alertness: Awake/alert Behavior During Therapy: WFL for tasks assessed/performed Overall Cognitive Status: Within Functional Limits for tasks assessed                                 General Comments: slight impaired/drunken (pain meds)      Exercises      General Comments        Pertinent Vitals/Pain Pain Assessment: 0-10 Pain Score: 5  Pain Location: L foot with activity/WBing Pain Descriptors / Indicators: Grimacing;Operative site guarding Pain Intervention(s): Monitored during session;Repositioned    Home Living  Prior Function            PT Goals (current goals can now be found in the care plan section) Progress towards PT goals: Progressing toward goals    Frequency    Min 3X/week      PT Plan Current plan remains appropriate    Co-evaluation              AM-PAC PT "6 Clicks" Daily Activity  Outcome Measure  Difficulty turning over in bed (including adjusting bedclothes, sheets and blankets)?: None Difficulty moving from lying on back to sitting on the side of the bed? : None Difficulty sitting down on and standing up from a chair with arms (e.g., wheelchair, bedside commode, etc,.)?: A Little Help needed moving to and from a bed to chair (including a wheelchair)?:  A Little Help needed walking in hospital room?: A Little Help needed climbing 3-5 steps with a railing? : A Lot 6 Click Score: 19    End of Session Equipment Utilized During Treatment: Gait belt Activity Tolerance: Patient limited by fatigue Patient left: in bed;with call bell/phone within reach;with family/visitor present   PT Visit Diagnosis: Muscle weakness (generalized) (M62.81);Difficulty in walking, not elsewhere classified (R26.2);Pain Pain - Right/Left: Left Pain - part of body: (foot)     Time: [id number] PT Time Calculation (min) (ACUTE ONLY): 30 min  Charges:  $Gait Training: 8-22 mins $Therapeutic Activity: 8-22 mins                    G Codes:       Felecia ShellingLori Dim Meisinger  PTA [facility]  Acute  Rehab Pager      [phone]

## 2017-01-06 ENCOUNTER — Ambulatory Visit: Payer: Self-pay | Admitting: Family Medicine

## 2017-01-06 LAB — AEROBIC/ANAEROBIC CULTURE (SURGICAL/DEEP WOUND)

## 2017-01-06 LAB — AEROBIC/ANAEROBIC CULTURE W GRAM STAIN (SURGICAL/DEEP WOUND)

## 2017-10-18 ENCOUNTER — Telehealth: Payer: Self-pay | Admitting: *Deleted

## 2017-10-18 DIAGNOSIS — Z794 Long term (current) use of insulin: Principal | ICD-10-CM

## 2017-10-18 DIAGNOSIS — L97509 Non-pressure chronic ulcer of other part of unspecified foot with unspecified severity: Secondary | ICD-10-CM

## 2017-10-18 DIAGNOSIS — E11621 Type 2 diabetes mellitus with foot ulcer: Secondary | ICD-10-CM

## 2017-10-18 NOTE — Telephone Encounter (Signed)
Medical Assistant left message on patient's home and cell voicemail. Voicemail states to give a call back to [place] [facility] at [phone]. Patient needs to be scheduled for fasting lab visit and NP APPOINTMENT.

## 2019-06-09 IMAGING — MR MR FOOT*L* W/O CM
4 of 5 series · 19 of 40 positions shown · non-contrast
Comparison: Left foot x-rays dated November 21, 2016.

CLINICAL DATA: Left plantar redness and swelling after stepping on
a thumb tack about a month ago. Evaluate for osteomyelitis.

EXAM:
MRI OF THE LEFT FOOT WITHOUT CONTRAST
TECHNIQUE: Multiplanar, multisequence MR imaging of the left forefoot was
performed. No intravenous contrast was administered.

[Series 3: T1 · coronal · 4.0mm · 0.29mm/px · 9 of 36 slices shown (1 of 2)]
[im 1/36]
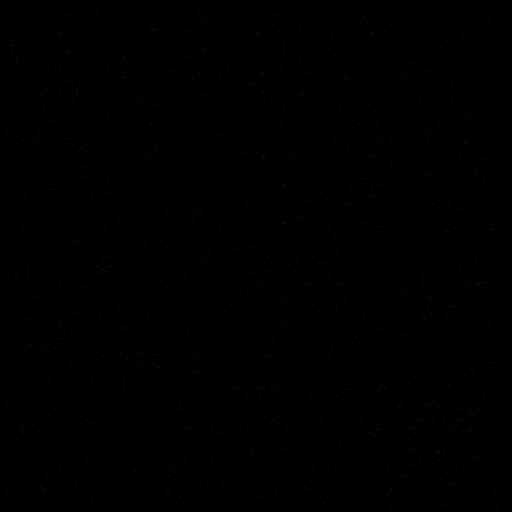
[im 7/36]
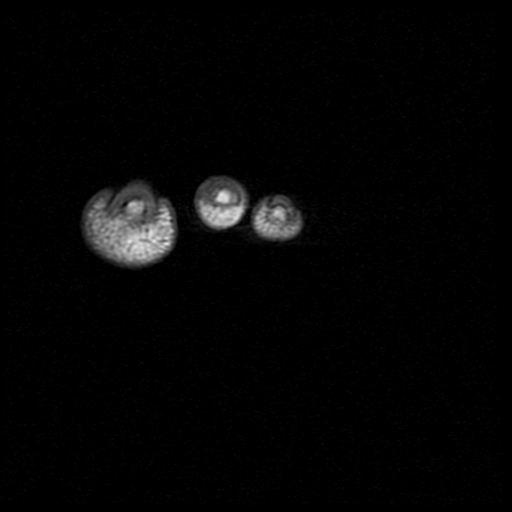
[im 10/36]
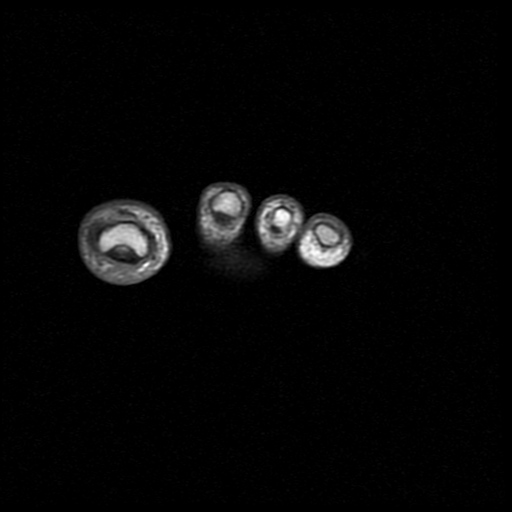
[im 16/36]
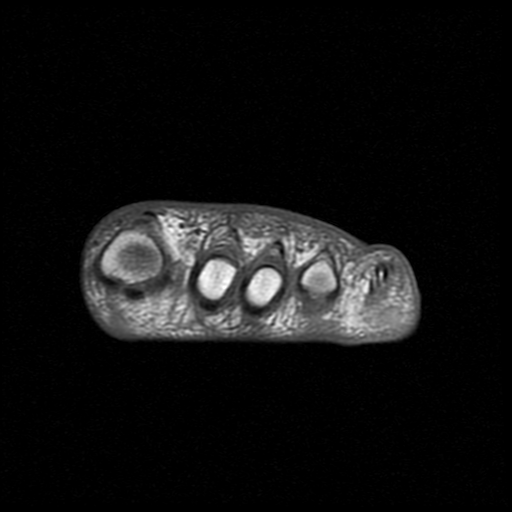
[im 20/36]
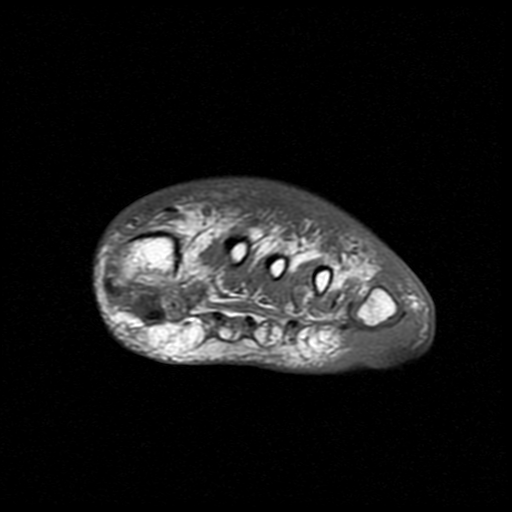
[im 26/36]
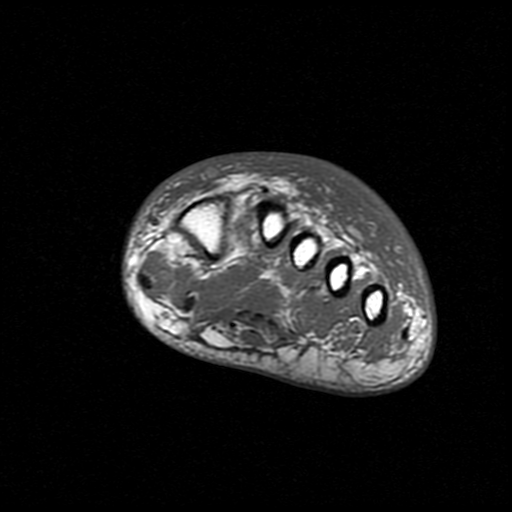
[im 29/36]
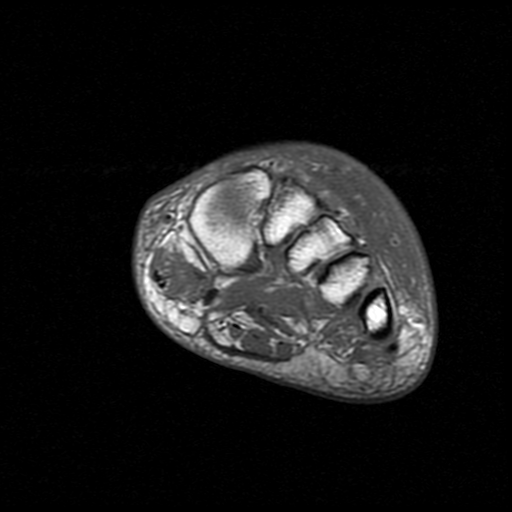
[im 32/36]
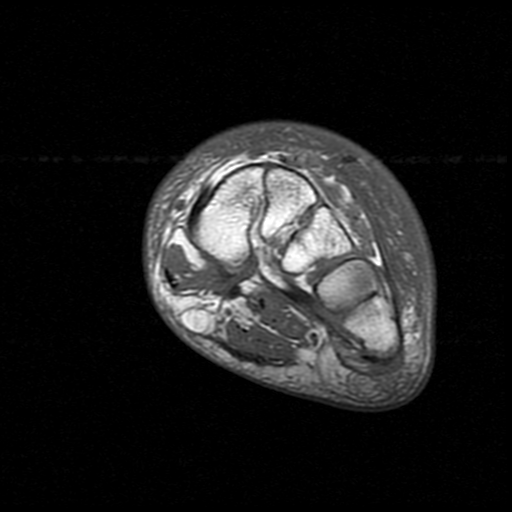
[im 36/36]
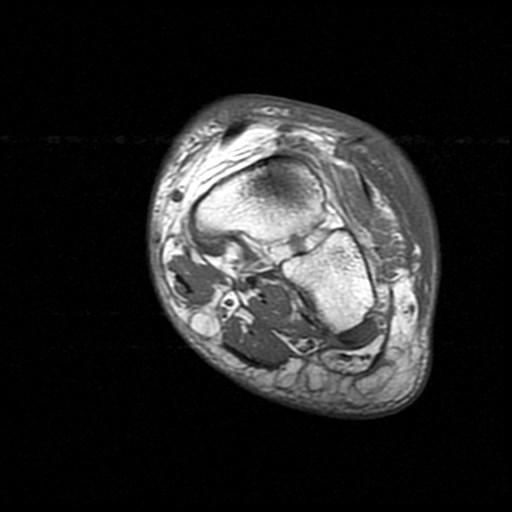

[Series 4: T2 · coronal · 4.0mm · 0.29mm/px · 3 of 36 slices shown]
[im 4/36]
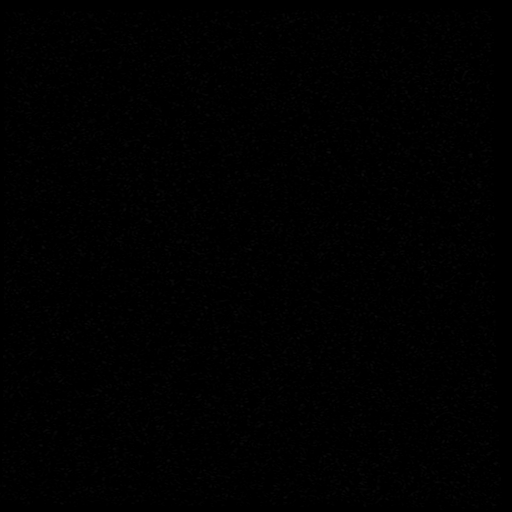
[im 18/36]
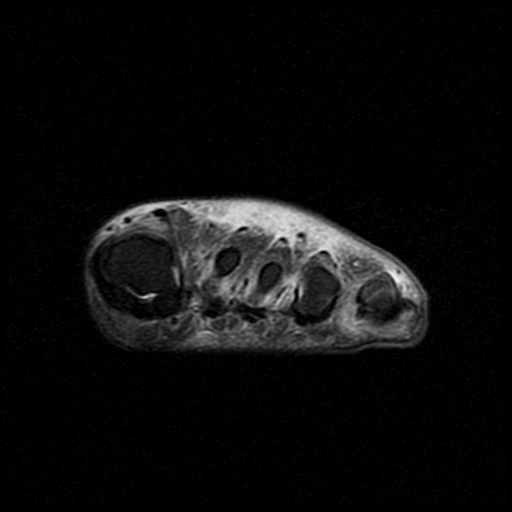
[im 32/36]
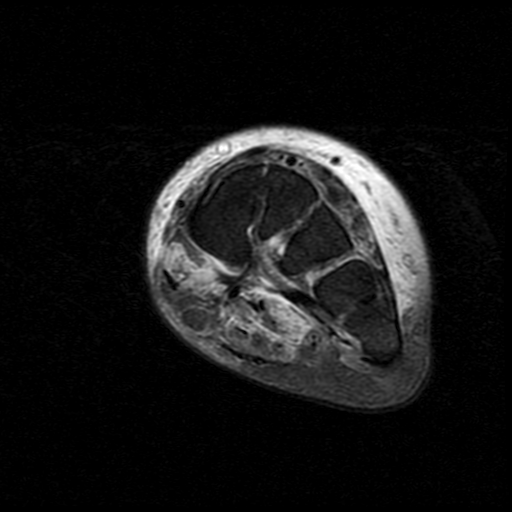

[Series 5: T1 · axial · 4.0mm · 0.33mm/px · z∈[-36,+49]mm · 4 of 18 slices shown (2 of 2)]
[im 1/18]
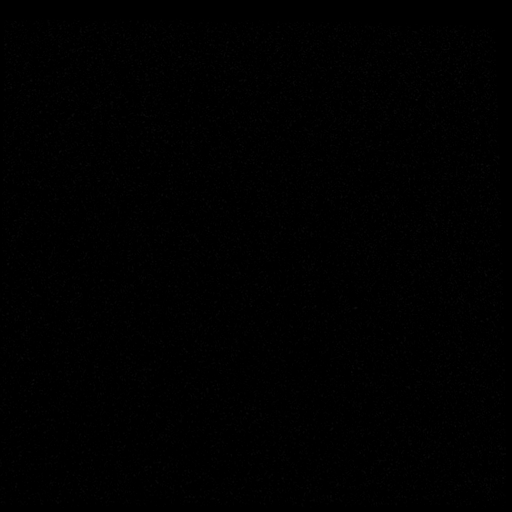
[im 5/18]
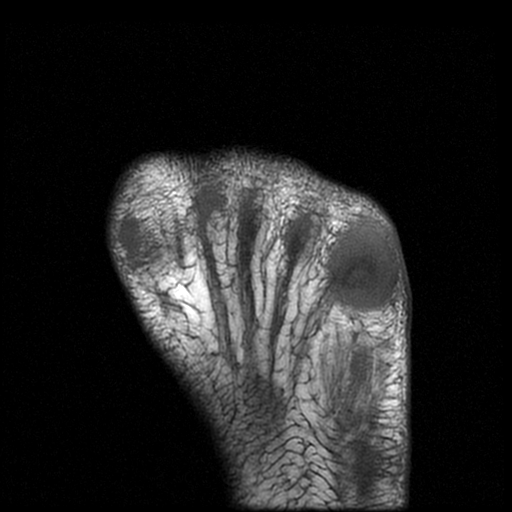
[im 9/18]
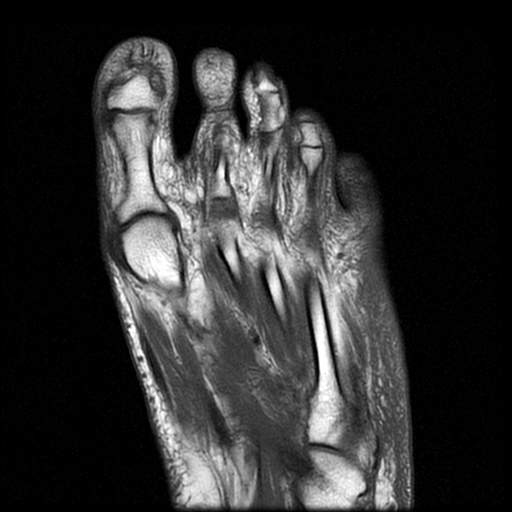
[im 18/18]
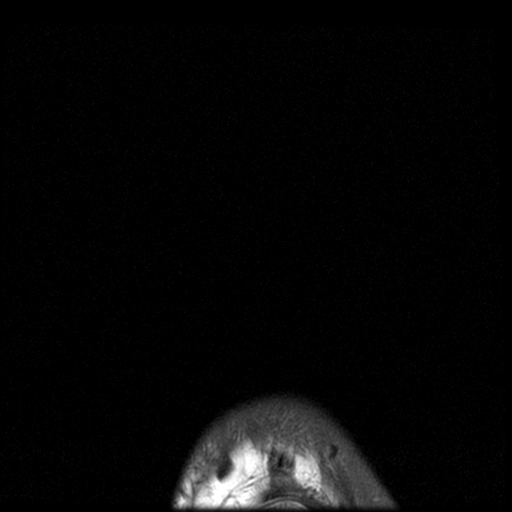

[Series 6: STIR · axial · 4.0mm · 0.33mm/px · z∈[-36,+49]mm · 3 of 18 slices shown]
[im 1/18]
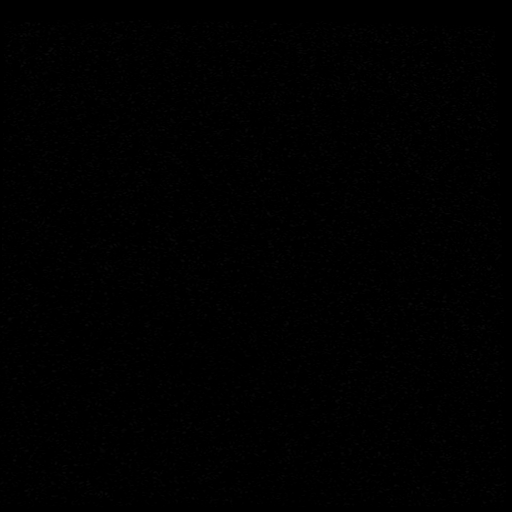
[im 9/18]
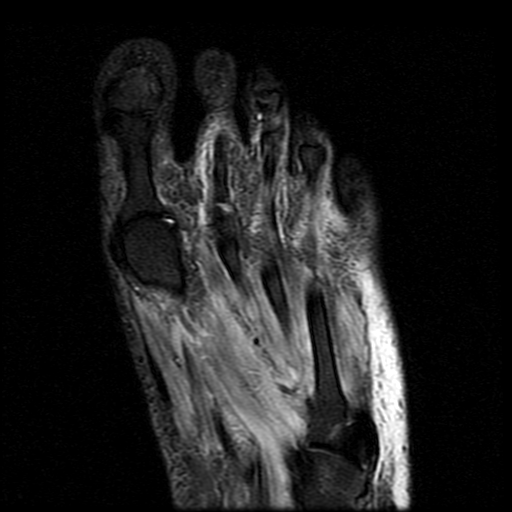
[im 18/18]
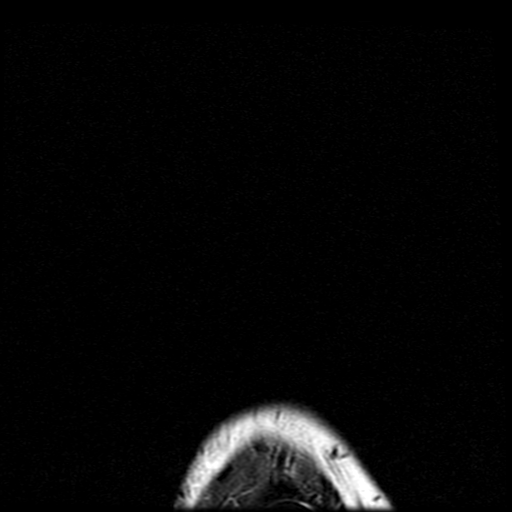

[19 of 40 positions shown; findings below may reference images not displayed]

FINDINGS: Bones/Joint/Cartilage

No suspicious marrow signal abnormality. No fracture or focal bone
lesion. No joint effusion.

Ligaments

The collateral ligaments are intact. The Lisfranc ligament is
intact.

Muscles and Tendons

Nonspecific edema of the intrinsic muscles of the forefoot, which
can be seen with diabetic muscle changes. The visualized flexor and
extensor tendons are intact.

Soft tissues

Plantar soft tissue induration and edema beneath the fifth
metatarsal head with questionable small skin defect. Prominent soft
tissue swelling of the dorsum of the forefoot. No discrete drainable
fluid collection.
IMPRESSION: 1. Plantar soft tissue induration and edema beneath the fifth
metatarsal head with questionable small skin defect. This may
represent the site of prior puncture wound. No drainable fluid
collection or osteomyelitis.
2. Prominent soft tissue swelling of the dorsum of the forefoot,
nonspecific, but can be seen with cellulitis.
